# Patient Record
Sex: Male | Born: 1990 | Race: Black or African American | Hispanic: No | Marital: Single | Smoking: Current some day smoker
Health system: Southern US, Community
[De-identification: ages and names within clinical notes are randomized; demographics above are authoritative.]

## PROBLEM LIST (undated history)

## (undated) DIAGNOSIS — E669 Obesity, unspecified: Secondary | ICD-10-CM

## (undated) HISTORY — DX: Obesity, unspecified: E66.9

## (undated) HISTORY — PX: OTHER SURGICAL HISTORY: SHX169

## (undated) HISTORY — PX: ELBOW SURGERY: SHX618

---
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
## 2010-08-30 NOTE — Procedures (Signed)
EEG NUMBER:  [id number]   HISTORY:  This is a 20 year old with seizure-like episode who is having an  EEG done to evaluate for seizures.   PROCEDURE:  This is a routine EEG.   TECHNICAL DESCRIPTION:  Throughout this routine EEG there is a posterior  dominant rhythm of 11-12 Hz activity with 15-25 microvolts.  The background  activity is symmetric, mostly comprised of alpha range activity at 10-30  microvolts.  With photic stimulation there is a symmetric photic driving  response noted.  Hyperventilation does not procedure any significant  abnormalities.  The patient does not go to sleep throughout this recording.  Throughout this record there is no evidence of electrographic seizures or  interictal discharge activity.   IMPRESSION:  This routine EEG is within normal limits in the awake state.      Bevelyn Buckles. Nash Shearer, M.D.  Electronically Signed     [id number]  D:  11/17/2005 11:33:52  T:  11/17/2005 14:01:40  Job #:  [id number]

## 2011-04-15 HISTORY — PX: OTHER SURGICAL HISTORY: SHX169

## 2011-07-20 ENCOUNTER — Emergency Department (HOSPITAL_COMMUNITY): Payer: No Typology Code available for payment source

## 2011-07-20 ENCOUNTER — Emergency Department (HOSPITAL_COMMUNITY)
Admission: EM | Admit: 2011-07-20 | Discharge: 2011-07-20 | Disposition: A | Discharge status: Home / Self Care | Payer: No Typology Code available for payment source | Attending: Emergency Medicine | Admitting: Emergency Medicine

## 2011-07-20 ENCOUNTER — Encounter (HOSPITAL_COMMUNITY): Payer: Self-pay | Admitting: Emergency Medicine

## 2011-07-20 DIAGNOSIS — S53106A Unspecified dislocation of unspecified ulnohumeral joint, initial encounter: Secondary | ICD-10-CM

## 2011-07-20 DIAGNOSIS — M25429 Effusion, unspecified elbow: Secondary | ICD-10-CM | POA: Insufficient documentation

## 2011-07-20 DIAGNOSIS — S53144A Lateral dislocation of right ulnohumeral joint, initial encounter: Secondary | ICD-10-CM

## 2011-07-20 DIAGNOSIS — S42401A Unspecified fracture of lower end of right humerus, initial encounter for closed fracture: Secondary | ICD-10-CM

## 2011-07-20 DIAGNOSIS — S53116A Anterior dislocation of unspecified ulnohumeral joint, initial encounter: Secondary | ICD-10-CM | POA: Insufficient documentation

## 2011-07-20 DIAGNOSIS — M25529 Pain in unspecified elbow: Secondary | ICD-10-CM | POA: Insufficient documentation

## 2011-07-20 DIAGNOSIS — R61 Generalized hyperhidrosis: Secondary | ICD-10-CM | POA: Insufficient documentation

## 2011-07-20 DIAGNOSIS — S42409A Unspecified fracture of lower end of unspecified humerus, initial encounter for closed fracture: Secondary | ICD-10-CM | POA: Insufficient documentation

## 2011-07-20 MED ORDER — FENTANYL CITRATE 0.05 MG/ML IJ SOLN
INTRAMUSCULAR | Status: AC
  Filled 2011-07-20: qty 2

## 2011-07-20 MED ORDER — FENTANYL CITRATE 0.05 MG/ML IJ SOLN
100.0000 ug | Freq: Once | INTRAMUSCULAR | Status: AC
  Administered 2011-07-20: 100 ug via INTRAVENOUS

## 2011-07-20 MED ORDER — PROPOFOL 10 MG/ML IV EMUL
INTRAVENOUS | Status: AC
  Administered 2011-07-20: 80 mg via INTRAVENOUS
  Filled 2011-07-20: qty 40

## 2011-07-20 MED ORDER — HYDROMORPHONE HCL PF 1 MG/ML IJ SOLN
1.0000 mg | Freq: Once | INTRAMUSCULAR | Status: AC
  Administered 2011-07-20: 1 mg via INTRAVENOUS
  Filled 2011-07-20: qty 1

## 2011-07-20 MED ORDER — PROPOFOL 10 MG/ML IV EMUL
30.0000 mL | INTRAVENOUS | Status: DC
  Administered 2011-07-20: 40 mg via INTRAVENOUS
  Administered 2011-07-20: 80 mg via INTRAVENOUS

## 2011-07-20 MED ORDER — BUPIVACAINE HCL (PF) 0.5 % IJ SOLN
INTRAMUSCULAR | Status: AC
  Administered 2011-07-20: 15:00:00
  Filled 2011-07-20: qty 10

## 2011-07-20 MED ORDER — OXYCODONE-ACETAMINOPHEN 5-325 MG PO TABS: ORAL_TABLET | ORAL | Status: AC
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
## 2011-07-20 NOTE — ED Notes (Signed)
Steward Scale 5 
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
## 2011-07-20 NOTE — ED Notes (Signed)
Steward Scale-6 Pt alert and oriented. No distress noted.

## 2011-07-20 NOTE — ED Notes (Signed)
Pt being bagged by Dr. Fonnie Jarvis to increase O2 sats. Pt increased to 95%.

## 2011-07-20 NOTE — ED Notes (Signed)
Ortho MD at the bedside. Pt medicated for pain. Place on cardiac monitor. Preparing to reset pt right elbow.

## 2011-07-20 NOTE — ED Notes (Signed)
Procedure completed and pt alert and oriented at this time. Vital signs stable. No distress noted.

## 2011-07-20 NOTE — ED Provider Notes (Signed)
Procedure: PSA  Preprocedure  Pre-anesthesia/induction confirmation of laterality/correct procedure site including "time-out."  Provider confirms review of the nurses' note, allergies, medications, pertinent labs, PMH, pre-induction vital signs, pulse oximetry, pain level, and ECG (as applicable), and patient condition satisfactory for commencing with order for sedation and procedure.  Orthopedic surgery performed the right elbow dislocation closed reduction while I performed the procedure of sedation and analgesia the patient tolerated well. The patient was maintained on high flow nasal cannula oxygen preprocedure preprocedure and postprocedure. He did have transient hypoventilation with brief bag valve mask assisted ventilations for transient hypoxia sat to the 80s for less than 30 seconds, otherwise his pulse oximetry was 97-100% preprocedure during procedure and post procedure. Patient was awake alert following commands well post procedure and improved function of his right hand post procedure with intact sensation and motor function distributions of the radial median and ulnar nerve function with recovery capability of extending his digits on his right hand at the metacarpophalangeal joints which he was unable to do a preprocedure according to orthopedics.   Patient tolerated procedure and procedural sedation component as expected without apparent immediate complications.  Physician confirms procedural medication orders as administered, patient was assessed by physician post-procedure, and confirms post-sedation plan of care and disposition.  Hurman Horn, MD 07/21/11 2156

## 2011-07-20 NOTE — Discharge Instructions (Signed)
Elbow Dislocation  Elbow dislocation is the displacement of the bones that form the elbow joint. Three bones come together to form the elbow. The humerus is the bone in the upper arm. The radius and ulna are the 2 bones in the forearm that form the lower part of the elbow. The elbow is held in place by very strong, fibrous tissues (ligaments) that connect the bones to each other.  CAUSES  Elbow dislocations are not common. Typically, they occur when a person falls forward with hands and elbows outstretched. The force of the impact is sent to the elbow. Usually, there is a twisting motion in this force. Elbow dislocations also happen during car crashes when passengers reach out to brace themselves during the impact.  RISK FACTORS  Although dislocation of the elbow can happen to anyone, some people are at greater risk than others. People at increased risk of elbow dislocation include:   People born with greater looseness in their ligaments.   People born with an ulna bone that has a shallow groove for the elbow hinge joint.  SYMPTOMS  Symptoms of a complete elbow dislocation usually are obvious. They include extreme pain and the appearance of a deformed arm.   Symptoms of a partial dislocation may not be obvious. Your elbow may move somewhat, but you may have pain and swelling. Also, there will likely be bruising on the inside and outside of your elbow where ligaments have been stretched or torn.   DIAGNOSIS   To diagnose elbow dislocation, your caregiver will perform a physical exam. During this exam, your caregiver will check your arm for tenderness, swelling, and deformity. The skin around your arm and the circulation in your arm also will be checked. Your pulse will be checked at your wrist. If your artery is injured during dislocation, your hand will be cool to the touch and may be white or purple in color. Your caregiver also may check your arm and your ability to move your wrist and fingers to see if you had  any damage to your nerves during dislocation.  An X-ray exam also may be done to determine if there is bone injury. Results of an X-ray exam can help show the direction of the dislocation.  If you have a simple dislocation, there is no major bone injury. If you have a complex dislocation, you may have broken bones (fractures) associated with the ligament injuries.  TREATMENT  For a simple elbow dislocation, your bones can usually be realigned in a procedure called a reduction. This is a treatment in which your bones are manually moved back into place either with the use of numbing medicine (regional anesthetic) around your elbow or medicine to make you sleep (general anesthetic). Then your elbow is kept immobile with a sling or a splint for 2 to 3 weeks. This is followed with physical therapy to help your joint move again.  Complex elbow dislocation may require surgery to restore joint alignment and repair ligaments. After surgery, your elbow may be protected with an external hinge. This device keeps your elbow from dislocating again while motion exercises are done. Additional surgery may be needed to repair any injuries to blood vessels and nerves or bones and ligaments or to relieve pressure from excessive swelling around the muscles.  HOME CARE INSTRUCTIONS  The following measures can help to reduce pain and hasten the healing process:   Rest your injured joint. Do not move it. Avoid activities similar to the one that caused   your injury.   Exercise your hand and fingers as instructed by your caregiver.   Apply ice to your injured joint for 1 to 2 days after your reduction or as directed by your caregiver. Applying ice helps to reduce inflammation and pain.   Put ice in a plastic bag.   Place a towel between your skin and the bag.   Leave the ice on for 15 to 20 minutes at a time, every couple of hours while you are awake.   Elevate your arm above your heart and move your wrist and fingers as instructed by  your caregiver to help limit swelling.   Take over-the-counter or prescription medicines for pain as directed by your caregiver.  SEEK IMMEDIATE MEDICAL CARE IF:   Your splint becomes damaged.   You have an external hinge and it becomes loose or will not move.   You have an external hinge and you develop drainage around the pins.   Your pain becomes worse rather than better.   You lose feeling in your hand or fingers.  MAKE SURE YOU:   Understand these instructions.   Will watch your condition.   Will get help right away if you are not doing well or get worse.  Document Released: 03/25/2001 Document Revised: 03/20/2011 Document Reviewed: 08/29/2010  [employer] Patient Information 2012 [employer].
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
## 2011-07-20 NOTE — ED Notes (Signed)
Steward Scale-4 at this time. No acute distress noted.

## 2011-07-20 NOTE — ED Notes (Signed)
Elbow reset by Ortho MD at this time. No complications noted.

## 2011-09-09 ENCOUNTER — Other Ambulatory Visit: Payer: Self-pay | Admitting: Orthopedic Surgery

## 2011-09-09 DIAGNOSIS — S53106A Unspecified dislocation of unspecified ulnohumeral joint, initial encounter: Secondary | ICD-10-CM

## 2011-09-12 ENCOUNTER — Ambulatory Visit
Admission: RE | Admit: 2011-09-12 | Discharge: 2011-09-12 | Disposition: A | Discharge status: Home / Self Care | Payer: 59 | Source: Ambulatory Visit | Attending: Orthopedic Surgery | Admitting: Orthopedic Surgery

## 2011-09-12 DIAGNOSIS — S53106A Unspecified dislocation of unspecified ulnohumeral joint, initial encounter: Secondary | ICD-10-CM

## 2011-09-18 ENCOUNTER — Ambulatory Visit (INDEPENDENT_AMBULATORY_CARE_PROVIDER_SITE_OTHER): Payer: 59 | Admitting: Internal Medicine

## 2011-09-18 VITALS — BP 135/84 | HR 59 | Temp 98.5°F | Resp 18 | Ht 73.0 in | Wt 348.0 lb

## 2011-09-18 DIAGNOSIS — B354 Tinea corporis: Secondary | ICD-10-CM

## 2011-09-18 DIAGNOSIS — R21 Rash and other nonspecific skin eruption: Secondary | ICD-10-CM

## 2011-09-18 DIAGNOSIS — L299 Pruritus, unspecified: Secondary | ICD-10-CM

## 2011-09-18 LAB — POCT SKIN KOH: Skin KOH, POC: NEGATIVE

## 2011-09-18 MED ORDER — ECONAZOLE NITRATE 1 % EX CREA: TOPICAL_CREAM | Freq: Every day | CUTANEOUS | Status: DC

## 2011-09-18 NOTE — Progress Notes (Signed)
  Subjective:    Patient ID: Matthew Mejia, male    DOB: 11/24/1990, 21 y.o.   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

## 2011-09-18 NOTE — Patient Instructions (Signed)
Ringworm, Body [Tinea Corporis]  Ringworm is a fungal infection of the skin and hair. Another name for this problem is Tinea Corporis. It has nothing to do with worms. A fungus is an organism that lives on dead cells (the outer layer of skin). It can involve the entire body. It can spread from infected pets. Tinea corporis can be a problem in wrestlers who may get the infection form other players/opponents, equipment and mats.  DIAGNOSIS   A skin scraping can be obtained from the affected area and by looking for fungus under the microscope. This is called a KOH examination.   HOME CARE INSTRUCTIONS    Ringworm may be treated with a topical antifungal cream, ointment, or oral medications.   If you are using a cream or ointment, wash infected skin. Dry it completely before application.   Scrub the skin with a buff puff or abrasive sponge using a shampoo with ketoconazole to remove dead skin and help treat the ringworm.   Have your pet treated by your veterinarian if it has the same infection.  SEEK MEDICAL CARE IF:    Your ringworm patch (fungus) continues to spread after 7 days of treatment.   Your rash is not gone in 4 weeks. Fungal infections are slow to respond to treatment. Some redness (erythema) may remain for several weeks after the fungus is gone.   The area becomes red, warm, tender, and swollen beyond the patch. This may be a secondary bacterial (germ) infection.   You have a fever.  Document Released: 03/28/2000 Document Revised: 03/20/2011 Document Reviewed: 09/08/2008  [employer] Patient Information 2012 [employer].

## 2011-10-21 ENCOUNTER — Ambulatory Visit: Payer: 59 | Attending: Orthopedic Surgery

## 2011-10-21 DIAGNOSIS — IMO0001 Reserved for inherently not codable concepts without codable children: Secondary | ICD-10-CM | POA: Insufficient documentation

## 2011-10-21 DIAGNOSIS — M25639 Stiffness of unspecified wrist, not elsewhere classified: Secondary | ICD-10-CM | POA: Insufficient documentation

## 2011-10-21 DIAGNOSIS — M25539 Pain in unspecified wrist: Secondary | ICD-10-CM | POA: Insufficient documentation

## 2011-10-29 ENCOUNTER — Ambulatory Visit: Payer: 59

## 2011-10-31 ENCOUNTER — Ambulatory Visit: Payer: 59

## 2011-11-04 ENCOUNTER — Ambulatory Visit: Payer: 59

## 2011-11-06 ENCOUNTER — Ambulatory Visit: Payer: 59

## 2011-11-14 ENCOUNTER — Ambulatory Visit: Payer: 59 | Attending: Orthopedic Surgery | Admitting: Physical Therapy

## 2011-11-14 DIAGNOSIS — M25639 Stiffness of unspecified wrist, not elsewhere classified: Secondary | ICD-10-CM | POA: Insufficient documentation

## 2011-11-14 DIAGNOSIS — IMO0001 Reserved for inherently not codable concepts without codable children: Secondary | ICD-10-CM | POA: Insufficient documentation

## 2011-11-14 DIAGNOSIS — M25539 Pain in unspecified wrist: Secondary | ICD-10-CM | POA: Insufficient documentation

## 2011-11-17 ENCOUNTER — Ambulatory Visit: Payer: 59

## 2011-11-19 ENCOUNTER — Ambulatory Visit: Payer: 59

## 2011-11-27 ENCOUNTER — Ambulatory Visit: Payer: 59

## 2011-12-02 ENCOUNTER — Other Ambulatory Visit: Payer: Self-pay | Admitting: Orthopedic Surgery

## 2011-12-02 DIAGNOSIS — S53106A Unspecified dislocation of unspecified ulnohumeral joint, initial encounter: Secondary | ICD-10-CM

## 2011-12-03 ENCOUNTER — Ambulatory Visit
Admission: RE | Admit: 2011-12-03 | Discharge: 2011-12-03 | Disposition: A | Discharge status: Home / Self Care | Payer: 59 | Source: Ambulatory Visit | Attending: Orthopedic Surgery | Admitting: Orthopedic Surgery

## 2011-12-03 DIAGNOSIS — S53106A Unspecified dislocation of unspecified ulnohumeral joint, initial encounter: Secondary | ICD-10-CM

## 2012-07-04 ENCOUNTER — Ambulatory Visit (INDEPENDENT_AMBULATORY_CARE_PROVIDER_SITE_OTHER): Payer: 59 | Admitting: Family Medicine

## 2012-07-04 ENCOUNTER — Ambulatory Visit: Payer: 59

## 2012-07-04 VITALS — BP 141/84 | HR 83 | Temp 98.0°F | Resp 18 | Ht 72.25 in | Wt 346.8 lb

## 2012-07-04 DIAGNOSIS — M549 Dorsalgia, unspecified: Secondary | ICD-10-CM

## 2012-07-04 DIAGNOSIS — R079 Chest pain, unspecified: Secondary | ICD-10-CM

## 2012-07-04 DIAGNOSIS — T148XXA Other injury of unspecified body region, initial encounter: Secondary | ICD-10-CM

## 2012-07-04 DIAGNOSIS — M940 Chondrocostal junction syndrome [Tietze]: Secondary | ICD-10-CM

## 2012-07-04 DIAGNOSIS — Z131 Encounter for screening for diabetes mellitus: Secondary | ICD-10-CM

## 2012-07-04 LAB — POCT CBC
Granulocyte percent: 63.9 %G (ref 37–80)
HCT, POC: 48.5 % (ref 43.5–53.7)
Hemoglobin: 16.3 g/dL (ref 14.1–18.1)
Lymph, poc: 2.6 (ref 0.6–3.4)
MCH, POC: 30.2 pg (ref 27–31.2)
MCHC: 33.6 g/dL (ref 31.8–35.4)
MCV: 89.9 fL (ref 80–97)
MID (cbc): 0.4 (ref 0–0.9)
MPV: 8.9 fL (ref 0–99.8)
POC Granulocyte: 5.4 (ref 2–6.9)
POC LYMPH PERCENT: 30.9 %L (ref 10–50)
POC MID %: 5.2 %M (ref 0–12)
Platelet Count, POC: 315 10*3/uL (ref 142–424)
RBC: 5.4 M/uL (ref 4.69–6.13)
RDW, POC: 14.7 %
WBC: 8.4 10*3/uL (ref 4.6–10.2)

## 2012-07-04 LAB — POCT GLYCOSYLATED HEMOGLOBIN (HGB A1C): Hemoglobin A1C: 5.1

## 2012-07-04 MED ORDER — CYCLOBENZAPRINE HCL 5 MG PO TABS: 5.0000 mg | ORAL_TABLET | Freq: Every evening | ORAL | Status: DC | PRN

## 2012-07-04 MED ORDER — DICLOFENAC SODIUM 75 MG PO TBEC: 75.0000 mg | DELAYED_RELEASE_TABLET | Freq: Two times a day (BID) | ORAL | Status: DC

## 2012-07-04 MED ORDER — HYDROCODONE-ACETAMINOPHEN 5-325 MG PO TABS: 1.0000 | ORAL_TABLET | Freq: Four times a day (QID) | ORAL | Status: DC | PRN

## 2012-07-04 NOTE — Patient Instructions (Signed)
Costochondritis Costochondritis (Tietze syndrome), or costochondral separation, is a swelling and irritation (inflammation) of the tissue (cartilage) that connects your ribs with your breastbone (sternum). It may occur on its own (spontaneously), through damage caused by an accident (trauma), or simply from coughing or minor exercise. It may take up to 6 weeks to get better and longer if you are unable to be conservative in your activities. HOME CARE INSTRUCTIONS   Avoid exhausting physical activity. Try not to strain your ribs during normal activity. This would include any activities using chest, belly (abdominal), and side muscles, especially if heavy weights are used.  Use ice for 15 to 20 minutes per hour while awake for the first 2 days. Place the ice in a plastic bag, and place a towel between the bag of ice and your skin.  Only take over-the-counter or prescription medicines for pain, discomfort, or fever as directed by your caregiver. SEEK IMMEDIATE MEDICAL CARE IF:   Your pain increases or you are very uncomfortable.  You have a fever.  You develop difficulty with your breathing.  You cough up blood.  You develop worse chest pains, shortness of breath, sweating, or vomiting.  You develop new, unexplained problems (symptoms). MAKE SURE YOU:   Understand these instructions.  Will watch your condition.  Will get help right away if you are not doing well or get worse. Document Released: 01/08/2005 Document Revised: 06/23/2011 Document Reviewed: 11/17/2007 [facility] Patient Information 2013 [place], [place]. Back Exercises Back exercises help treat and prevent back injuries. The goal is to increase your strength in your belly (abdominal) and back muscles. These exercises can also help with flexibility. Start these exercises when told by your doctor. HOME CARE Back exercises include: Pelvic Tilt.  Lie on your back with your knees bent. Tilt your pelvis until the lower part of  your back is against the floor. Hold this position 5 to 10 sec. Repeat this exercise 5 to 10 times. Knee to Chest.  Pull 1 knee up against your chest and hold for 20 to 30 seconds. Repeat this with the other knee. This may be done with the other leg straight or bent, whichever feels better. Then, pull both knees up against your chest. Sit-Ups or Curl-Ups.  Bend your knees 90 degrees. Start with tilting your pelvis, and do a partial, slow sit-up. Only lift your upper half 30 to 45 degrees off the floor. Take at least 2 to 3 seonds for each sit-up. Do not do sit-ups with your knees out straight. If partial sit-ups are difficult, simply do the above but with only tightening your belly (abdominal) muscles and holding it as told. Hip-Lift.  Lie on your back with your knees flexed 90 degrees. Push down with your feet and shoulders as you raise your hips 2 inches off the floor. Hold for 10 seconds, repeat 5 to 10 times. Back Arches.  Lie on your stomach. Prop yourself up on bent elbows. Slowly press on your hands, causing an arch in your low back. Repeat 3 to 5 times. Shoulder-Lifts.  Lie face down with arms beside your body. Keep hips and belly pressed to floor as you slowly lift your head and shoulders off the floor. Do not overdo your exercises. Be careful in the beginning. Exercises may cause you some mild back discomfort. If the pain lasts for more than 15 minutes, stop the exercises until you see your doctor. Improvement with exercise for back problems is slow.  Document Released: 05/03/2010 Document Revised: 06/23/2011  Document Reviewed: 01/30/2011 [facility] Patient Information 2013 [place], [place].
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
## 2012-07-10 ENCOUNTER — Emergency Department (HOSPITAL_COMMUNITY)
Admission: EM | Admit: 2012-07-10 | Discharge: 2012-07-10 | Disposition: A | Discharge status: Home / Self Care | Payer: 59 | Attending: Emergency Medicine | Admitting: Emergency Medicine

## 2012-07-10 ENCOUNTER — Encounter (HOSPITAL_COMMUNITY): Payer: Self-pay | Admitting: Emergency Medicine

## 2012-07-10 DIAGNOSIS — IMO0002 Reserved for concepts with insufficient information to code with codable children: Secondary | ICD-10-CM | POA: Insufficient documentation

## 2012-07-10 DIAGNOSIS — F17200 Nicotine dependence, unspecified, uncomplicated: Secondary | ICD-10-CM | POA: Insufficient documentation

## 2012-07-10 DIAGNOSIS — E669 Obesity, unspecified: Secondary | ICD-10-CM | POA: Insufficient documentation

## 2012-07-10 DIAGNOSIS — M545 Low back pain, unspecified: Secondary | ICD-10-CM

## 2012-07-10 DIAGNOSIS — Y990 Civilian activity done for income or pay: Secondary | ICD-10-CM | POA: Insufficient documentation

## 2012-07-10 DIAGNOSIS — Y9389 Activity, other specified: Secondary | ICD-10-CM | POA: Insufficient documentation

## 2012-07-10 DIAGNOSIS — Y9289 Other specified places as the place of occurrence of the external cause: Secondary | ICD-10-CM | POA: Insufficient documentation

## 2012-07-10 DIAGNOSIS — X500XXA Overexertion from strenuous movement or load, initial encounter: Secondary | ICD-10-CM | POA: Insufficient documentation
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
## 2012-07-10 NOTE — ED Notes (Signed)
Pt reports "my whole back hurts."  C/o back pain x 1 week.  States he started a new job 2 weeks ago and is having to do a lot of bending.

## 2012-07-19 ENCOUNTER — Encounter: Payer: Self-pay | Admitting: *Deleted

## 2012-07-19 ENCOUNTER — Encounter: Payer: Self-pay | Admitting: Family Medicine

## 2012-07-19 ENCOUNTER — Ambulatory Visit (INDEPENDENT_AMBULATORY_CARE_PROVIDER_SITE_OTHER): Payer: 59 | Admitting: Family Medicine

## 2012-07-19 VITALS — BP 120/92 | HR 88 | Temp 98.4°F | Ht 73.0 in | Wt 352.6 lb

## 2012-07-19 DIAGNOSIS — M79609 Pain in unspecified limb: Secondary | ICD-10-CM

## 2012-07-19 DIAGNOSIS — M62830 Muscle spasm of back: Secondary | ICD-10-CM | POA: Insufficient documentation

## 2012-07-19 DIAGNOSIS — M5380 Other specified dorsopathies, site unspecified: Secondary | ICD-10-CM

## 2012-07-19 DIAGNOSIS — M549 Dorsalgia, unspecified: Secondary | ICD-10-CM

## 2012-07-19 DIAGNOSIS — T148XXA Other injury of unspecified body region, initial encounter: Secondary | ICD-10-CM

## 2012-07-19 DIAGNOSIS — M79641 Pain in right hand: Secondary | ICD-10-CM

## 2012-07-19 LAB — CBC WITH DIFFERENTIAL/PLATELET
Basophils Absolute: 0 10*3/uL (ref 0.0–0.1)
Eosinophils Absolute: 0.2 10*3/uL (ref 0.0–0.7)
Lymphs Abs: 2 10*3/uL (ref 0.7–4.0)
MCHC: 33.6 g/dL (ref 30.0–36.0)
MCV: 86.5 fl (ref 78.0–100.0)
Monocytes Absolute: 0.5 10*3/uL (ref 0.1–1.0)
Neutrophils Relative %: 59.5 % (ref 43.0–77.0)
Platelets: 260 10*3/uL (ref 150.0–400.0)
RDW: 14.2 % (ref 11.5–14.6)
WBC: 6.7 10*3/uL (ref 4.5–10.5)

## 2012-07-19 LAB — LIPID PANEL
HDL: 35.3 mg/dL — ABNORMAL LOW (ref >39.00)
Total CHOL/HDL Ratio: 5
Triglycerides: 148 mg/dL (ref 0.0–149.0)
VLDL: 29.6 mg/dL (ref 0.0–40.0)

## 2012-07-19 LAB — BASIC METABOLIC PANEL
Calcium: 8.7 mg/dL (ref 8.4–10.5)
GFR: 92.03 mL/min (ref >60.00)
Potassium: 4.2 mEq/L (ref 3.5–5.1)
Sodium: 138 mEq/L (ref 135–145)

## 2012-07-19 LAB — HEPATIC FUNCTION PANEL
AST: 34 U/L (ref 0–37)
Alkaline Phosphatase: 46 U/L (ref 39–117)
Total Bilirubin: 0.4 mg/dL (ref 0.3–1.2)

## 2012-07-19 MED ORDER — CYCLOBENZAPRINE HCL 5 MG PO TABS: 5.0000 mg | ORAL_TABLET | Freq: Every evening | ORAL | Status: DC | PRN

## 2012-07-19 MED ORDER — MELOXICAM 15 MG PO TABS: 15.0000 mg | ORAL_TABLET | Freq: Every day | ORAL | Status: DC
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
## 2012-07-19 NOTE — Patient Instructions (Addendum)
Schedule your complete physical at your convenienc We'll notify you of your lab results and make any changes if needed Start the Mobic once daily for inflammation Use the flexeril as needed for spasm Use a heating pad as needed for spasm/pain relief We'll call you with your physical therapy appt Call with any questions or concerns Hang in there!!

## 2012-07-20 NOTE — Assessment & Plan Note (Signed)
New to provider, ongoing for pt.  Suspect this is factoring into his low back pain.  Check labs to risk stratify.  Stressed importance of healthy diet and regular exercise.  Will follow.

## 2012-07-20 NOTE — Assessment & Plan Note (Signed)
New.  No evidence of bony tenderness, (-) SLR, no red flags on hx or PE.  Suspect his weight and recent job change (now on feet all day) are contributing to pain.  Will start scheduled NSAIDs, muscle relaxer for night.  Refer to PT for core strengthening.  No narcotics at this time.  Will follow.

## 2012-07-20 NOTE — Assessment & Plan Note (Signed)
New.  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  Start daily NSAIDs.  No evidence to support gout, RA, or other inflammatory or autoimmune process.  Will follow.

## 2012-07-28 NOTE — ED Provider Notes (Signed)
Medical screening examination/treatment/procedure(s) were performed by non-physician practitioner and as supervising physician I was immediately available for consultation/collaboration.  Arliss Hepburn, MD 07/28/12 0819 

## 2013-08-17 ENCOUNTER — Ambulatory Visit (INDEPENDENT_AMBULATORY_CARE_PROVIDER_SITE_OTHER): Payer: 59 | Admitting: Family Medicine

## 2013-08-17 VITALS — BP 118/86 | HR 95 | Temp 98.4°F | Resp 16 | Ht 72.0 in | Wt 325.6 lb

## 2013-08-17 DIAGNOSIS — R059 Cough, unspecified: Secondary | ICD-10-CM

## 2013-08-17 DIAGNOSIS — J029 Acute pharyngitis, unspecified: Secondary | ICD-10-CM

## 2013-08-17 DIAGNOSIS — R05 Cough: Secondary | ICD-10-CM

## 2013-08-17 DIAGNOSIS — J22 Unspecified acute lower respiratory infection: Secondary | ICD-10-CM

## 2013-08-17 DIAGNOSIS — J988 Other specified respiratory disorders: Secondary | ICD-10-CM

## 2013-08-17 LAB — POCT RAPID STREP A (OFFICE): Rapid Strep A Screen: NEGATIVE

## 2013-08-17 MED ORDER — BENZONATATE 100 MG PO CAPS: 200.0000 mg | ORAL_CAPSULE | Freq: Two times a day (BID) | ORAL | Status: DC | PRN

## 2013-08-17 MED ORDER — AMOXICILLIN-POT CLAVULANATE 875-125 MG PO TABS: 1.0000 | ORAL_TABLET | Freq: Two times a day (BID) | ORAL | Status: DC

## 2013-08-17 MED ORDER — HYDROCODONE-HOMATROPINE 5-1.5 MG/5ML PO SYRP: 5.0000 mL | ORAL_SOLUTION | Freq: Every evening | ORAL | Status: DC | PRN

## 2013-08-17 NOTE — Progress Notes (Signed)
 Chief Complaint:  Chief Complaint  Patient presents with  . Cough    HPI: Matthew Mejia is a 23 y.o. male who is here for thick green discharge, coughing up stuff x 3 days. He has sorenes sin his back when he cough, he feel like "he is throwing up a lung". Denies  fevers or chills. + night sweats. No sick contacts , no ear pain, no SOB/CP. HAs some facial pain.  He ahs tied nothing for it. Has tried Halls without releif.  He and his partner are fhaving a baby in 1 week, he is not sure if he is UTD on his TDaP They will name her Angelica RanLiana Thsi will be hist first baby   Past Medical History  Diagnosis Date  . Obesity    Past Surgical History  Procedure Laterality Date  . Left knee sx 4-5 years ago    . Knee surgery  2006  . Arm surgery  2013  . Elbow surgery     History   Social History  . Marital Status: Single    Spouse Name: N/A    Number of Children: N/A  . Years of Education: N/A   Social History Main Topics  . Smoking status: Current Every Day Smoker -- 0.50 packs/day for 0 years    Types: Cigarettes  . Smokeless tobacco: Never Used  . Alcohol Use: 1.2 oz/week    2 Cans of beer per week  . Drug Use: No  . Sexual Activity: Yes    Birth Control/ Protection: Condom   Other Topics Concern  . None   Social History Narrative  . None   Family History  Problem Relation Age of Onset  . Diabetes Mother   . Heart disease Mother   . Kidney disease Mother   . Hypertension Mother   . Hyperlipidemia Mother   . Hypertension Father    No Known Allergies Prior to Admission medications   Medication Sig Start Date End Date Taking? Authorizing Provider  cyclobenzaprine (FLEXERIL) 5 MG tablet Take 1 tablet (5 mg total) by mouth at bedtime as needed for muscle spasms. 07/19/12   Sheliah HatchKatherine E Tabori, MD  meloxicam (MOBIC) 15 MG tablet Take 1 tablet (15 mg total) by mouth daily. 07/19/12   Sheliah HatchKatherine E Tabori, MD     ROS: The patient denies night sweats, unintentional  weight loss, chest pain, palpitations, wheezing, dyspnea on exertion, nausea, vomiting, abdominal pain, dysuria, hematuria, melena, numbness, weakness, or tingling.   All other systems have been reviewed and were otherwise negative with the exception of those mentioned in the HPI and as above.    PHYSICAL EXAM: Filed Vitals:   08/17/13 1400  BP: 118/86  Pulse: 95  Temp: 98.4 F (36.9 C)  Resp: 16   Filed Vitals:   08/17/13 1400  Height: 6' (1.829 m)  Weight: 325 lb 9.6 oz (147.691 kg)   Body mass index is 44.15 kg/(m^2).  General: Alert, no acute distress HEENT:  Normocephalic, atraumatic, oropharynx patent. EOMI, PERRLA, tm nl, + sinus tenderness, erythem throat, no exudates Cardiovascular:  Regular rate and rhythm, no rubs murmurs or gallops.  No Carotid bruits, radial pulse intact. No pedal edema.  Respiratory: Clear to auscultation bilaterally.  No wheezes, rales, or rhonchi.  No cyanosis, no use of accessory musculature GI: No organomegaly, abdomen is soft and non-tender, positive bowel sounds.  No masses. Skin: No rashes. Neurologic: Facial musculature symmetric. Psychiatric: Patient is appropriate throughout our interaction.  Lymphatic: No cervical lymphadenopathy Musculoskeletal: Gait intact.   LABS: Results for orders placed in visit on 08/17/13  CULTURE, GROUP A STREP      Result Value Ref Range   Preliminary Report No Suspicious Colonies, Continuing to Hold    POCT RAPID STREP A (OFFICE)      Result Value Ref Range   Rapid Strep A Screen Negative  Negative     EKG/XRAY:   [id number]   ASSESSMENT/PLAN: Encounter Diagnoses  Name Primary?  . Acute pharyngitis Yes  . Lower respiratory infection   . Cough    Throat cx pending Rx Augmentin if no improvement with sx treatment Rx Hycodan, rx Tessalon Perles HE will return to get chest xray prn also needs TDaP at some point F/u prn  Gross sideeffects, risk and benefits, and  alternatives of medications d/w patient. Patient is aware that all medications have potential sideeffects and we are unable to predict every sideeffect or drug-drug interaction that may occur.   P , DO 08/19/2013 8:15 AM

## 2013-08-19 LAB — CULTURE, GROUP A STREP: Organism ID, Bacteria: NORMAL

## 2014-03-22 ENCOUNTER — Ambulatory Visit (INDEPENDENT_AMBULATORY_CARE_PROVIDER_SITE_OTHER): Payer: 59 | Admitting: Emergency Medicine

## 2014-03-22 ENCOUNTER — Ambulatory Visit (INDEPENDENT_AMBULATORY_CARE_PROVIDER_SITE_OTHER): Payer: 59

## 2014-03-22 VITALS — BP 138/76 | HR 90 | Temp 98.2°F | Resp 18 | Ht 73.0 in | Wt 375.0 lb

## 2014-03-22 DIAGNOSIS — M25572 Pain in left ankle and joints of left foot: Secondary | ICD-10-CM

## 2014-03-22 DIAGNOSIS — S9032XA Contusion of left foot, initial encounter: Secondary | ICD-10-CM

## 2014-03-22 DIAGNOSIS — S9031XA Contusion of right foot, initial encounter: Secondary | ICD-10-CM

## 2014-03-22 MED ORDER — HYDROCODONE-ACETAMINOPHEN 5-325 MG PO TABS: 1.0000 | ORAL_TABLET | ORAL | Status: DC | PRN

## 2014-03-22 NOTE — Patient Instructions (Signed)

## 2014-03-22 NOTE — Progress Notes (Signed)
[facility] [address], [place] [id number] [phone]  Date:  03/22/2014   Name:  Matthew Mejia   DOB:  19-Nov-1990   MRN:  [id number]  PCP:  Neena RhymesKatherine Tabori, MD    Chief Complaint: Foot Pain   History of Present Illness:  Matthew Mejia is a 23 y.o. very pleasant male patient who presents with the following:  Sunday injured his foot.  Uncertain of exact injury.  Has progressively increasing swelling and pain Not able to bear weight. No improvement with over the counter medications or other home remedies.  Denies other complaint or health concern today.   Patient Active Problem List   Diagnosis Date Noted  . Lumbar paraspinal muscle spasm 07/19/2012  . Bilateral hand pain 07/19/2012  . Morbid obesity 07/19/2012  . Dislocation of elbow, lateral, right, closed 07/20/2011    Past Medical History  Diagnosis Date  . Obesity     Past Surgical History  Procedure Laterality Date  . Left knee sx 4-5 years ago    . Knee surgery  2006  . Arm surgery  2013  . Elbow surgery      History  Substance Use Topics  . Smoking status: Current Every Day Smoker -- 0.50 packs/day for 0 years    Types: Cigarettes  . Smokeless tobacco: Never Used  . Alcohol Use: 1.2 oz/week    2 Cans of beer per week    Family History  Problem Relation Age of Onset  . Diabetes Mother   . Heart disease Mother   . Kidney disease Mother   . Hypertension Mother   . Hyperlipidemia Mother   . Hypertension Father     No Known Allergies  Medication list has been reviewed and updated.  Current Outpatient Prescriptions on File Prior to Visit  Medication Sig Dispense Refill  . amoxicillin-clavulanate (AUGMENTIN) 875-125 MG per tablet Take 1 tablet by mouth 2 (two) times daily. (Patient not taking: Reported on 03/22/2014) 20 tablet 0  . benzonatate (TESSALON) 100 MG capsule Take 2 capsules (200 mg total) by mouth 2 (two) times daily as needed for cough. (Patient not taking:  Reported on 03/22/2014) 30 capsule 1  . cyclobenzaprine (FLEXERIL) 5 MG tablet Take 1 tablet (5 mg total) by mouth at bedtime as needed for muscle spasms. (Patient not taking: Reported on 03/22/2014) 30 tablet 1  . HYDROcodone-homatropine (HYCODAN) 5-1.5 MG/5ML syrup Take 5 mLs by mouth at bedtime as needed for cough. (Patient not taking: Reported on 03/22/2014) 120 mL 0  . meloxicam (MOBIC) 15 MG tablet Take 1 tablet (15 mg total) by mouth daily. (Patient not taking: Reported on 03/22/2014) 30 tablet 3   No current facility-administered medications on file prior to visit.    Review of Systems:  As per HPI, otherwise negative.    Physical Examination: Filed Vitals:   03/22/14 1438  BP: 138/76  Pulse: 90  Temp: 98.2 F (36.8 C)  Resp: 18   Filed Vitals:   03/22/14 1438  Height: 6\' 1"  (1.854 m)  Weight: 375 lb (170.099 kg)   Body mass index is 49.49 kg/(m^2). Ideal Body Weight: Weight in (lb) to have BMI = 25: 189.1   GEN: morbid obesity, NAD, Non-toxic, Alert & Oriented x 3 HEENT: Atraumatic, Normocephalic.  Ears and Nose: No external deformity. EXTR: No clubbing/cyanosis/edema NEURO: Normal gait.  PSYCH: Normally interactive. Conversant. Not depressed or anxious appearing.  Calm demeanor.  Left foot tender fifth metatarsal with no  deformity but moderate swelling   Assessment and Plan: Contusion foot Boot RICE vicodin  Signed,  Phillips OdorJeffery Romulus Hanrahan, MD   [facility] reading (PRIMARY) by  Dr. Dareen PianoAnderson.  No osseous injury.

## 2015-01-13 ENCOUNTER — Ambulatory Visit: Payer: Self-pay

## 2016-04-29 ENCOUNTER — Ambulatory Visit: Payer: Self-pay

## 2016-07-04 ENCOUNTER — Other Ambulatory Visit: Payer: Self-pay | Admitting: Orthopedic Surgery

## 2016-07-04 DIAGNOSIS — R52 Pain, unspecified: Secondary | ICD-10-CM

## 2016-07-04 DIAGNOSIS — R609 Edema, unspecified: Secondary | ICD-10-CM

## 2016-07-13 ENCOUNTER — Ambulatory Visit
Admission: RE | Admit: 2016-07-13 | Discharge: 2016-07-13 | Disposition: A | Discharge status: Home / Self Care | Payer: BLUE CROSS/BLUE SHIELD | Source: Ambulatory Visit | Attending: Orthopedic Surgery | Admitting: Orthopedic Surgery

## 2016-07-13 DIAGNOSIS — R609 Edema, unspecified: Secondary | ICD-10-CM

## 2016-07-13 DIAGNOSIS — R52 Pain, unspecified: Secondary | ICD-10-CM

## 2017-03-01 ENCOUNTER — Observation Stay (HOSPITAL_COMMUNITY)
Admission: EM | Admit: 2017-03-01 | Discharge: 2017-03-03 | Disposition: A | Discharge status: Home / Self Care | Payer: BLUE CROSS/BLUE SHIELD | Attending: Internal Medicine | Admitting: Internal Medicine

## 2017-03-01 ENCOUNTER — Emergency Department (HOSPITAL_COMMUNITY): Payer: BLUE CROSS/BLUE SHIELD

## 2017-03-01 ENCOUNTER — Other Ambulatory Visit: Payer: Self-pay

## 2017-03-01 ENCOUNTER — Encounter (HOSPITAL_COMMUNITY): Payer: Self-pay | Admitting: Emergency Medicine

## 2017-03-01 DIAGNOSIS — Z6841 Body Mass Index (BMI) 40.0 and over, adult: Secondary | ICD-10-CM | POA: Insufficient documentation

## 2017-03-01 DIAGNOSIS — R519 Headache, unspecified: Secondary | ICD-10-CM

## 2017-03-01 DIAGNOSIS — M542 Cervicalgia: Secondary | ICD-10-CM

## 2017-03-01 DIAGNOSIS — R509 Fever, unspecified: Secondary | ICD-10-CM | POA: Diagnosis present

## 2017-03-01 DIAGNOSIS — R05 Cough: Secondary | ICD-10-CM | POA: Diagnosis not present

## 2017-03-01 DIAGNOSIS — R51 Headache: Secondary | ICD-10-CM

## 2017-03-01 DIAGNOSIS — G030 Nonpyogenic meningitis: Principal | ICD-10-CM | POA: Insufficient documentation

## 2017-03-01 DIAGNOSIS — F17290 Nicotine dependence, other tobacco product, uncomplicated: Secondary | ICD-10-CM | POA: Insufficient documentation

## 2017-03-01 DIAGNOSIS — Z8249 Family history of ischemic heart disease and other diseases of the circulatory system: Secondary | ICD-10-CM | POA: Diagnosis not present

## 2017-03-01 DIAGNOSIS — R400 Somnolence: Secondary | ICD-10-CM

## 2017-03-01 LAB — URINALYSIS, ROUTINE W REFLEX MICROSCOPIC
BACTERIA UA: NONE SEEN
Bilirubin Urine: NEGATIVE
Glucose, UA: NEGATIVE mg/dL
Hgb urine dipstick: NEGATIVE
Ketones, ur: 5 mg/dL — AB
Nitrite: NEGATIVE
PROTEIN: NEGATIVE mg/dL
SQUAMOUS EPITHELIAL / LPF: NONE SEEN
Specific Gravity, Urine: 1.032 — ABNORMAL HIGH (ref 1.005–1.030)
pH: 5 (ref 5.0–8.0)

## 2017-03-01 LAB — INFLUENZA PANEL BY PCR (TYPE A & B)
INFLAPCR: NEGATIVE
Influenza B By PCR: NEGATIVE

## 2017-03-01 LAB — COMPREHENSIVE METABOLIC PANEL
ALBUMIN: 3.9 g/dL (ref 3.5–5.0)
ALK PHOS: 51 U/L (ref 38–126)
ALT: 35 U/L (ref 17–63)
ANION GAP: 10 (ref 5–15)
AST: 32 U/L (ref 15–41)
BUN: 15 mg/dL (ref 6–20)
CALCIUM: 8.5 mg/dL — AB (ref 8.9–10.3)
CO2: 26 mmol/L (ref 22–32)
Chloride: 102 mmol/L (ref 101–111)
Creatinine, Ser: 1.4 mg/dL — ABNORMAL HIGH (ref 0.61–1.24)
GFR calc Af Amer: >60 mL/min (ref >60)
GFR calc non Af Amer: >60 mL/min (ref >60)
GLUCOSE: 95 mg/dL (ref 65–99)
Potassium: 3.5 mmol/L (ref 3.5–5.1)
SODIUM: 138 mmol/L (ref 135–145)
Total Bilirubin: 0.6 mg/dL (ref 0.3–1.2)
Total Protein: 7.3 g/dL (ref 6.5–8.1)

## 2017-03-01 LAB — CBC WITH DIFFERENTIAL/PLATELET
BASOS PCT: 0 %
Basophils Absolute: 0 10*3/uL (ref 0.0–0.1)
Eosinophils Absolute: 0 10*3/uL (ref 0.0–0.7)
Eosinophils Relative: 0 %
HEMATOCRIT: 42.4 % (ref 39.0–52.0)
Hemoglobin: 14.4 g/dL (ref 13.0–17.0)
LYMPHS PCT: 20 %
Lymphs Abs: 1.9 10*3/uL (ref 0.7–4.0)
MCH: 29.9 pg (ref 26.0–34.0)
MCHC: 34 g/dL (ref 30.0–36.0)
MCV: 88 fL (ref 78.0–100.0)
MONO ABS: 0.4 10*3/uL (ref 0.1–1.0)
MONOS PCT: 4 %
NEUTROS ABS: 7 10*3/uL (ref 1.7–7.7)
Neutrophils Relative %: 76 %
Platelets: 259 10*3/uL (ref 150–400)
RBC: 4.82 MIL/uL (ref 4.22–5.81)
RDW: 14 % (ref 11.5–15.5)
WBC: 9.3 10*3/uL (ref 4.0–10.5)

## 2017-03-01 LAB — I-STAT CG4 LACTIC ACID, ED: Lactic Acid, Venous: 1.14 mmol/L (ref 0.5–1.9)

## 2017-03-01 MED ORDER — SODIUM CHLORIDE 0.9 % IV SOLN: 2.0000 g | Freq: Once | INTRAVENOUS | Status: DC

## 2017-03-01 MED ORDER — SODIUM CHLORIDE 0.9 % IV BOLUS (SEPSIS)
1000.0000 mL | Freq: Once | INTRAVENOUS | Status: AC
  Administered 2017-03-02: 1000 mL via INTRAVENOUS

## 2017-03-01 MED ORDER — DEXTROSE 5 % IV SOLN: 2.0000 g | INTRAVENOUS | Status: DC

## 2017-03-01 MED ORDER — SODIUM CHLORIDE 0.9 % IV SOLN
2500.0000 mg | INTRAVENOUS | Status: DC
  Filled 2017-03-01: qty 2500

## 2017-03-01 MED ORDER — AMPICILLIN SODIUM 2 G IJ SOLR
2.0000 g | INTRAMUSCULAR | Status: DC
  Filled 2017-03-01 (×3): qty 2000

## 2017-03-01 MED ORDER — DEXAMETHASONE SODIUM PHOSPHATE 10 MG/ML IJ SOLN
10.0000 mg | Freq: Once | INTRAMUSCULAR | Status: DC
  Filled 2017-03-01: qty 1

## 2017-03-01 MED ORDER — LIDOCAINE HCL 1 % IJ SOLN
INTRAMUSCULAR | Status: AC
  Administered 2017-03-02: 20 mL
  Filled 2017-03-01: qty 20

## 2017-03-01 MED ORDER — DEXTROSE 5 % IV SOLN
2.0000 g | Freq: Once | INTRAVENOUS | Status: DC
  Filled 2017-03-01: qty 2

## 2017-03-01 MED ORDER — SODIUM CHLORIDE 0.9 % IV BOLUS (SEPSIS)
1000.0000 mL | Freq: Once | INTRAVENOUS | Status: AC
  Administered 2017-03-01: 1000 mL via INTRAVENOUS

## 2017-03-01 MED ORDER — ACETAMINOPHEN 325 MG PO TABS
650.0000 mg | ORAL_TABLET | Freq: Once | ORAL | Status: AC
  Administered 2017-03-01: 650 mg via ORAL
  Filled 2017-03-01: qty 2

## 2017-03-01 MED ORDER — VANCOMYCIN HCL IN DEXTROSE 1-5 GM/200ML-% IV SOLN: 1000.0000 mg | Freq: Once | INTRAVENOUS | Status: DC

## 2017-03-01 NOTE — ED Notes (Signed)
Dr.Tegeler at bedside to evaluate pt.

## 2017-03-01 NOTE — ED Triage Notes (Signed)
Pt brought in from home via EMS  Pt states he has been sick for a couple weeks and has developed a headache  Stabbing pain, c/o neck pain  [facility] was given a steroid shot and given flexeril  Pt was seen again today at the same urgent care and was given a shot for pain  Pt was told if it got worse to go to the hospital  Pt has had a fever for past couple of weeks

## 2017-03-01 NOTE — ED Notes (Signed)
ED Provider at bedside. 

## 2017-03-01 NOTE — ED Notes (Signed)
Dr.Tegeler at bedside with pt.

## 2017-03-01 NOTE — Progress Notes (Signed)
A consult was received from an ED physician for rocephin/vancomycin and ampicillin per pharmacy dosing.  The patient's profile has been reviewed for ht/wt/allergies/indication/available labs.   A one time order has been placed for Rocephin 2 Gm, Vancomycin 2500 mg and ampicillin 2 Gm.  Further antibiotics/pharmacy consults should be ordered by admitting physician if indicated.                       Thank you, Lorenza EvangelistGreen, Lucill Mauck R 03/01/2017  9:39 PM

## 2017-03-01 NOTE — ED Provider Notes (Addendum)
[facility]-EMERGENCY DEPT Provider Note   CSN: [id number] Arrival date & time: 03/01/17  1927     History   Chief Complaint Chief Complaint  Patient presents with  . Headache    HPI Matthew Mejia is a 26 y.o. male.  The history is provided by the patient, a parent and medical records. No language interpreter was used.  Headache   This is a new problem. The current episode started more than 1 week ago (2 weeks). The problem occurs constantly. The problem has not changed since onset.The headache is associated with nothing. Pain location: all over. The quality of the pain is described as dull. The pain is moderate. The pain radiates to the left neck and right neck. Associated symptoms include a fever, malaise/fatigue and nausea. Pertinent negatives include no anorexia, no chest pressure, no near-syncope, no syncope, no shortness of breath and no vomiting. He has tried nothing for the symptoms. The treatment provided no relief.  Cough  This is a new problem. The problem occurs constantly. The problem has been resolved. The cough is non-productive. The maximum temperature recorded prior to his arrival was 102 to 102.9 F. Associated symptoms include chills, sweats and headaches. Pertinent negatives include no chest pain, no rhinorrhea, no sore throat, no shortness of breath and no wheezing. He has tried nothing for the symptoms. His past medical history does not include asthma.    Past Medical History:  Diagnosis Date  . Obesity     Patient Active Problem List   Diagnosis Date Noted  . Lumbar paraspinal muscle spasm 07/19/2012  . Bilateral hand pain 07/19/2012  . Morbid obesity (HCC) 07/19/2012  . Dislocation of elbow, lateral, right, closed 07/20/2011    Past Surgical History:  Procedure Laterality Date  . arm surgery  2013  . ELBOW SURGERY    . KNEE SURGERY  2006  . left knee sx 4-5 years ago         Home Medications    Prior to Admission  medications   Medication Sig Start Date End Date Taking? Authorizing Provider  amoxicillin-clavulanate (AUGMENTIN) 875-125 MG per tablet Take 1 tablet by mouth 2 (two) times daily. Patient not taking: Reported on 03/22/2014 08/17/13   Le, Thao P, DO  benzonatate (TESSALON) 100 MG capsule Take 2 capsules (200 mg total) by mouth 2 (two) times daily as needed for cough. Patient not taking: Reported on 03/22/2014 08/17/13   Le, Thao P, DO  cyclobenzaprine (FLEXERIL) 5 MG tablet Take 1 tablet (5 mg total) by mouth at bedtime as needed for muscle spasms. Patient not taking: Reported on 03/22/2014 07/19/12   Sheliah Hatch, MD  HYDROcodone-acetaminophen [facility]) 5-325 MG per tablet Take 1-2 tablets by mouth every 4 (four) hours as needed. 03/22/14   Carmelina Dane, MD  HYDROcodone-homatropine [facility]) 5-1.5 MG/5ML syrup Take 5 mLs by mouth at bedtime as needed for cough. Patient not taking: Reported on 03/22/2014 08/17/13   Le, Thao P, DO  meloxicam (MOBIC) 15 MG tablet Take 1 tablet (15 mg total) by mouth daily. Patient not taking: Reported on 03/22/2014 07/19/12   Sheliah Hatch, MD    Family History Family History  Problem Relation Age of Onset  . Diabetes Mother   . Heart disease Mother   . Kidney disease Mother   . Hypertension Mother   . Hyperlipidemia Mother   . Hypertension Father     Social History Social History   Tobacco Use  . Smoking  status: Current Some Day Smoker    Packs/day: 0.00    Years: 0.00    Pack years: 0.00    Types: Cigars  . Smokeless tobacco: Never Used  Substance Use Topics  . Alcohol use: Yes    Alcohol/week: 1.2 oz    Types: 2 Cans of beer per week  . Drug use: No     Allergies   Patient has no known allergies.   Review of Systems Review of Systems  Constitutional: Positive for chills, fatigue, fever and malaise/fatigue. Negative for diaphoresis.  HENT: Negative for congestion, mouth sores, rhinorrhea and sore throat.   Eyes: Positive for  visual disturbance (blurry vision).  Respiratory: Positive for cough. Negative for choking, chest tightness, shortness of breath, wheezing and stridor.   Cardiovascular: Negative for chest pain, syncope and near-syncope.  Gastrointestinal: Positive for nausea. Negative for abdominal pain, anorexia, blood in stool, constipation, diarrhea and vomiting.  Genitourinary: Positive for decreased urine volume. Negative for dysuria, flank pain and frequency.  Musculoskeletal: Positive for neck pain and neck stiffness. Negative for back pain.  Skin: Negative for pallor, rash and wound.  Neurological: Positive for light-headedness and headaches. Negative for dizziness, seizures, speech difficulty, weakness and numbness.  Psychiatric/Behavioral: Negative for agitation.  All other systems reviewed and are negative.    Physical Exam Updated Vital Signs BP (!) 145/84 (BP Location: Right Arm)   Pulse 92   Temp (!) 102.7 F (39.3 C) (Oral)   Resp 18   Ht 6\' 1"  (1.854 m)   Wt (!) 154.2 kg (340 lb)   SpO2 100%   BMI 44.86 kg/m   Physical Exam  Constitutional: He appears well-developed and well-nourished. No distress.  HENT:  Head: Normocephalic and atraumatic.  Mouth/Throat: Oropharynx is clear and moist.  Eyes: Conjunctivae and EOM are normal. Pupils are equal, round, and reactive to light. Right pupil is round and reactive. Left pupil is round and reactive. Pupils are equal.  Neck: Muscular tenderness present. Neck rigidity present. No edema and no erythema present.  Patient reports pain with neck flexion.  Patient can move his neck side to side and backwards.  Patient has some tenderness in the paraspinal areas of the back of the neck.  Cardiovascular: Normal heart sounds.  No murmur heard. Pulmonary/Chest: Effort normal and breath sounds normal. No stridor. No respiratory distress. He has no wheezes. He exhibits no tenderness.  Abdominal: Soft. Bowel sounds are normal. He exhibits no  distension. There is no tenderness.  Musculoskeletal: He exhibits no edema or tenderness.  Neurological: He has normal strength. He is not disoriented. He displays no tremor. No cranial nerve deficit or sensory deficit. He exhibits normal muscle tone. Coordination normal. GCS eye subscore is 4. GCS verbal subscore is 5. GCS motor subscore is 6.  Patient has no focal neurologic deficits on my initial exam.  Patient had normal extraocular movements.  Normal pupils.  No facial droop.  Normal strength in arms and legs.  Normal sensation throughout.  Is somnolent but is arousable with voice.  Skin: Skin is warm. Capillary refill takes less than 2 seconds. He is not diaphoretic. No erythema. No pallor.  Psychiatric: His mood appears not anxious.  Nursing note and vitals reviewed.    ED Treatments / Results  Labs (all labs ordered are listed, but only abnormal results are displayed) Labs Reviewed  COMPREHENSIVE METABOLIC PANEL - Abnormal; Notable for the following components:      Result Value   Creatinine, Ser 1.40 (*)  Calcium 8.5 (*)    All other components within normal limits  URINALYSIS, ROUTINE W REFLEX MICROSCOPIC - Abnormal; Notable for the following components:   Color, Urine AMBER (*)    Specific Gravity, Urine 1.032 (*)    Ketones, ur 5 (*)    Leukocytes, UA TRACE (*)    All other components within normal limits  BASIC METABOLIC PANEL - Abnormal; Notable for the following components:   Creatinine, Ser 1.25 (*)    Calcium 7.6 (*)    All other components within normal limits  CSF CELL COUNT WITH DIFFERENTIAL - Abnormal; Notable for the following components:   Appearance, CSF CLEAR (*)    RBC Count, CSF 2 (*)    WBC, CSF 70 (*)    Segmented Neutrophils-CSF 65 (*)    Lymphs, CSF 20 (*)    All other components within normal limits  CSF CULTURE  CULTURE, BLOOD (ROUTINE X 2)  CULTURE, BLOOD (ROUTINE X 2)  URINE CULTURE  CULTURE, FUNGUS WITHOUT SMEAR  CBC WITH  DIFFERENTIAL/PLATELET  INFLUENZA PANEL BY PCR (TYPE A & B)  HIV ANTIBODY (ROUTINE TESTING)  PROCALCITONIN  CBC  GLUCOSE, CSF  PROTEIN, CSF  PATHOLOGIST SMEAR REVIEW  CRYPTOCOCCAL ANTIGEN, CSF  HERPES SIMPLEX VIRUS(HSV) DNA BY PCR  RPR  I-STAT CG4 LACTIC ACID, ED  I-STAT CG4 LACTIC ACID, ED    EKG  EKG Interpretation  Date/Time:  Sunday March 01 2017 21:34:04 EST Ventricular Rate:  88 PR Interval:    QRS Duration: 86 QT Interval:  324 QTC Calculation: 392 R Axis:   39 Text Interpretation:  Sinus rhythm When compared to prior, no signifnicant changes seen.  No STEMI Confirmed by Theda Belfastegeler, Chris ([id number]) on 03/01/2017 11:15:22 PM Also confirmed by Theda Belfastegeler, Chris ([id number]), editor Madalyn RobEverhart, Marilyn [phone])  on 03/02/2017 8:25:37 AM       Radiology Dg Chest 2 View  Result Date: 03/01/2017 CLINICAL DATA:  Fevers EXAM: CHEST  2 VIEW COMPARISON:  07/04/2012 FINDINGS: The overall inspiratory effort is poor with crowding of the vascular markings. No acute infiltrate or sizable effusion is seen. Cardiac shadow is accentuated by the frontal technique. No bony abnormality is noted. IMPRESSION: Poor inspiratory effort without acute abnormality. Electronically Signed   By: Alcide CleverMark  Lukens M.D.   On: 03/01/2017 21:13   Ct Head Wo Contrast  Result Date: 03/01/2017 CLINICAL DATA:  Initial evaluation for acute altered mental status, headache. EXAM: CT HEAD WITHOUT CONTRAST TECHNIQUE: Contiguous axial images were obtained from the base of the skull through the vertex without intravenous contrast. COMPARISON:  Prior CT from 11/01/2005. FINDINGS: Brain: Cerebral volume within normal limits for patient age. No evidence for acute intracranial hemorrhage. No findings to suggest acute large vessel territory infarct. No mass lesion, midline shift, or mass effect. Ventricles are normal in size without evidence for hydrocephalus. No extra-axial fluid collection identified. Vascular: No hyperdense vessel  identified. Skull: Scalp soft tissues demonstrate no acute abnormality.Calvarium intact. Sinuses/Orbits: Globes and orbital soft tissues are within normal limits. Visualized paranasal sinuses are clear. No mastoid effusion. IMPRESSION: Normal head CT.  No acute intracranial abnormality. Electronically Signed   By: Rise MuBenjamin  McClintock M.D.   On: 03/01/2017 22:47   Dg Fluoro Guide Lumbar Puncture  Result Date: 03/02/2017 CLINICAL DATA:  Intractable headache EXAM: DIAGNOSTIC LUMBAR PUNCTURE UNDER FLUOROSCOPIC GUIDANCE FLUOROSCOPY TIME:  Fluoroscopy Time:  24 seconds Radiation Exposure Index (if provided by the fluoroscopic device): 5.5 mGy Number of Acquired Spot Images: 1 PROCEDURE: Informed consent was obtained  from the patient prior to the procedure, including potential complications of headache, allergy, and pain. With the patient prone, the lower back was prepped with Betadine. 1% Lidocaine was used for local anesthesia. Lumbar puncture was performed at the L3-4 level using a 22 gauge needle with return of clear CSF. 9 ml of CSF were obtained for laboratory studies. The patient tolerated the procedure well and there were no apparent complications. IMPRESSION: Successful fluoroscopic guided lumbar puncture, as described above. Electronically Signed   By: Charline Bills M.D.   On: 03/02/2017 11:19    Procedures .Lumbar Puncture Date/Time: 03/02/2017 1:42 PM Performed by: Heide Scales, MD Authorized by: Heide Scales, MD   Consent:    Consent obtained:  Written   Consent given by:  Patient   Risks discussed:  Infection, pain, repeat procedure, nerve damage, headache and bleeding   Alternatives discussed:  No treatment and delayed treatment Pre-procedure details:    Procedure purpose:  Diagnostic   Preparation: Patient was prepped and draped in usual sterile fashion   Anesthesia (see MAR for exact dosages):    Anesthesia method:  Local infiltration   Local anesthetic:   Lidocaine 1% w/o epi Procedure details:    Lumbar space:  L4-L5 interspace   Patient position:  Sitting   Needle gauge:  22   Needle type:  Spinal needle - Quincke tip   Needle length (in):  5.0   Ultrasound guidance: no     Number of attempts:  1 Post-procedure:    Patient tolerance of procedure:  Tolerated well, no immediate complications Comments:     Unsuccessful LP attempt.    (including critical care time)  Medications Ordered in ED Medications  acetaminophen (TYLENOL) tablet 650 mg (650 mg Oral Given 03/02/17 1202)    Or  acetaminophen (TYLENOL) suppository 650 mg ( Rectal See Alternative 03/02/17 1202)  ondansetron (ZOFRAN) tablet 4 mg (not administered)    Or  ondansetron (ZOFRAN) injection 4 mg (not administered)  lidocaine (XYLOCAINE) 1 % (with pres) injection (not administered)  ketorolac (TORADOL) 30 MG/ML injection 30 mg (not administered)  0.9 %  sodium chloride infusion (not administered)  oxyCODONE-acetaminophen (PERCOCET/ROXICET) 5-325 MG per tablet 1-2 tablet (not administered)  prochlorperazine (COMPAZINE) injection 10 mg (not administered)  sodium chloride 0.9 % bolus 1,000 mL (0 mLs Intravenous Stopped 03/01/17 2230)    And  sodium chloride 0.9 % bolus 1,000 mL (0 mLs Intravenous Stopped 03/01/17 2300)    And  sodium chloride 0.9 % bolus 1,000 mL (0 mLs Intravenous Stopped 03/02/17 0031)    And  sodium chloride 0.9 % bolus 1,000 mL (0 mLs Intravenous Stopped 03/01/17 2356)    And  sodium chloride 0.9 % bolus 1,000 mL (0 mLs Intravenous Stopped 03/02/17 0048)  acetaminophen (TYLENOL) tablet 650 mg (650 mg Oral Given 03/01/17 2221)  lidocaine (XYLOCAINE) 1 % (with pres) injection (20 mLs  Given 03/02/17 0040)     Initial Impression / Assessment and Plan / ED Course  I have reviewed the triage vital signs and the nursing notes.  Pertinent labs & imaging results that were available during my care of the patient were reviewed by me and considered in my  medical decision making (see chart for details).     Matthew Mejia is a 26 y.o. male with a past medical history significant for morbid obesity who presents with multiple complaints including fevers, chills, headache, neck pain, nausea, blurry vision, dry cough, decreased urination, and sleepiness.  Patient  is coming by his father.  Patient's father and patient report that for the last 2 weeks, patient has not been having gradually worsening symptoms.  He reports having headache and neck pain for which she saw an urgent care several days ago.  He says that he has been given to injections of steroids and pain medicine without significant relief of symptoms.  He says that tonight he continued to have symptoms prompting him to seek evaluation.  He reports having a moderate to severe headache across his head.  He reports he is having neck pain and neck stiffness.  He denies any numbness, tingling, or weakness of extremities but has been having some mild blurry vision.  He denies diplopia.  He reports nausea but no vomiting.  He reports having a dry cough several days ago that has improved.  He reports having no sore throat.  He reports his urine has been decreased as he is not eating and drinking as much.  He denies dysuria.  He denies any constipation or diarrhea.  He denies low back pain abdominal pain or chest pain.  His father says that he is been sleeping all day and is somnolent.  Patient has not had this constellation of symptoms in the past.  On exam, patient had normal extraocular movements.  Pupils were reactive bilaterally.  Patient had pain with flexion of the neck.  Patient was able to move his neck side to side and extend it without significant pain.  Lungs were clear.  Chest was nontender.  Back was nontender.  No CVA tenderness.  Abdomen was nontender.  Patient could move all extremities and had normal sensation in all extremities.  Normal sensation bilaterally in the face.  Patient was somnolent  but arousable to voice.  Patient had vital signs collected when he felt warm and had a temperature over 102.  Patient also had a heart rate during my exam of just greater than 100.  Patient was tachypneic.  Given the symptoms, code sepsis will be called.  Patient started on fluids.  Patient had an influenza test obtained which was negative.  Due to the patient's symptoms of somnolence, headache, neck stiffness, and fevers, meningitis has not yet been ruled out.  With the blurry vision, a CT of the head was obtained prior to lumbar puncture attempted.  CT of the head showed no acute abnormality.  Patient's laboratory testing also began to return showing normal lactic acid, normal white blood cell count, no infection in the urine, and CMP overall reassuring aside from slightly creatinine of 1.4.  Fluids were given.  Blood cultures were collected.  Given patient's symptoms and otherwise negative flu testing, patient will likely need lumbar puncture followed by antibiotics and admission.  11:36 PM Patient was reassessed and he continued to have headaches and somnolence.  Patient says that he wants the lumbar puncture.  Patient will have lumbar puncture attempted and will likelt be given antibiotics after.  Patient will require admission for further management.  LP unsuccessful.   Hospitlaist called for admission for further management. Hospitalist requested holding antibiotics until LP can be preformed.   PT admitted.    Final Clinical Impressions(s) / ED Diagnoses   Final diagnoses:  Acute intractable headache, unspecified headache type  Fever, unspecified fever cause  Somnolence  Neck pain    ED Discharge Orders    None      Clinical Impression: 1. Acute intractable headache, unspecified headache type   2. Fever, unspecified fever cause  3. Somnolence   4. Neck pain   5. Headache     Disposition: Admit to hospitalist service    Tegeler, Canary Brim, MD 03/02/17  1343    Tegeler, Canary Brim, MD 03/02/17 1345

## 2017-03-01 NOTE — ED Notes (Signed)
Pt reports improvement in symptoms at this time along with decreasing pain to 8/10 from 10/10

## 2017-03-01 NOTE — ED Notes (Signed)
Pt reports having headache for the last 2 weeks with fever that developed today. Pt did not display any sensitivity to light on exam. And was laying in bed in semi-fowlers position with no acute distress.

## 2017-03-01 NOTE — ED Notes (Signed)
Initial presentation of pt laying on right side in fetal position.

## 2017-03-02 ENCOUNTER — Observation Stay (HOSPITAL_COMMUNITY): Payer: BLUE CROSS/BLUE SHIELD

## 2017-03-02 DIAGNOSIS — R519 Headache, unspecified: Secondary | ICD-10-CM | POA: Diagnosis present

## 2017-03-02 DIAGNOSIS — R51 Headache: Secondary | ICD-10-CM

## 2017-03-02 DIAGNOSIS — R509 Fever, unspecified: Secondary | ICD-10-CM | POA: Diagnosis present

## 2017-03-02 DIAGNOSIS — M542 Cervicalgia: Secondary | ICD-10-CM | POA: Diagnosis not present

## 2017-03-02 DIAGNOSIS — G030 Nonpyogenic meningitis: Secondary | ICD-10-CM

## 2017-03-02 LAB — BASIC METABOLIC PANEL
ANION GAP: 7 (ref 5–15)
BUN: 13 mg/dL (ref 6–20)
CALCIUM: 7.6 mg/dL — AB (ref 8.9–10.3)
CO2: 23 mmol/L (ref 22–32)
Chloride: 109 mmol/L (ref 101–111)
Creatinine, Ser: 1.25 mg/dL — ABNORMAL HIGH (ref 0.61–1.24)
Glucose, Bld: 96 mg/dL (ref 65–99)
Potassium: 3.8 mmol/L (ref 3.5–5.1)
SODIUM: 139 mmol/L (ref 135–145)

## 2017-03-02 LAB — CBC
HCT: 40.5 % (ref 39.0–52.0)
Hemoglobin: 13.5 g/dL (ref 13.0–17.0)
MCH: 29.4 pg (ref 26.0–34.0)
MCHC: 33.3 g/dL (ref 30.0–36.0)
MCV: 88.2 fL (ref 78.0–100.0)
Platelets: 245 10*3/uL (ref 150–400)
RBC: 4.59 MIL/uL (ref 4.22–5.81)
RDW: 14.2 % (ref 11.5–15.5)
WBC: 8.4 10*3/uL (ref 4.0–10.5)

## 2017-03-02 LAB — CSF CELL COUNT WITH DIFFERENTIAL
Lymphs, CSF: 20 % — ABNORMAL LOW (ref 40–80)
MONOCYTE-MACROPHAGE-SPINAL FLUID: 16 % (ref 15–45)
RBC Count, CSF: 2 /mm3 — ABNORMAL HIGH
Segmented Neutrophils-CSF: 65 % — ABNORMAL HIGH (ref 0–6)
TUBE #: 4
WBC, CSF: 70 /mm3 (ref 0–5)

## 2017-03-02 LAB — GLUCOSE, CSF: GLUCOSE CSF: 55 mg/dL (ref 40–70)

## 2017-03-02 LAB — PATHOLOGIST SMEAR REVIEW

## 2017-03-02 LAB — PROCALCITONIN

## 2017-03-02 LAB — PROTEIN, CSF: TOTAL PROTEIN, CSF: 39 mg/dL (ref 15–45)

## 2017-03-02 LAB — CRYPTOCOCCAL ANTIGEN, CSF: Crypto Ag: NEGATIVE

## 2017-03-02 LAB — HIV ANTIBODY (ROUTINE TESTING W REFLEX): HIV Screen 4th Generation wRfx: NONREACTIVE

## 2017-03-02 MED ORDER — OXYCODONE-ACETAMINOPHEN 5-325 MG PO TABS: 1.0000 | ORAL_TABLET | ORAL | Status: DC | PRN

## 2017-03-02 MED ORDER — ACETAMINOPHEN 325 MG PO TABS
650.0000 mg | ORAL_TABLET | Freq: Four times a day (QID) | ORAL | Status: DC | PRN
  Administered 2017-03-02 (×2): 650 mg via ORAL
  Filled 2017-03-02 (×2): qty 2

## 2017-03-02 MED ORDER — LIDOCAINE HCL 1 % IJ SOLN
INTRAMUSCULAR | Status: AC
  Filled 2017-03-02: qty 20

## 2017-03-02 MED ORDER — ACETAMINOPHEN 650 MG RE SUPP: 650.0000 mg | Freq: Four times a day (QID) | RECTAL | Status: DC | PRN

## 2017-03-02 MED ORDER — KETOROLAC TROMETHAMINE 30 MG/ML IJ SOLN
30.0000 mg | Freq: Four times a day (QID) | INTRAMUSCULAR | Status: DC | PRN
  Administered 2017-03-02: 30 mg via INTRAVENOUS
  Filled 2017-03-02: qty 1

## 2017-03-02 MED ORDER — ONDANSETRON HCL 4 MG PO TABS: 4.0000 mg | ORAL_TABLET | Freq: Four times a day (QID) | ORAL | Status: DC | PRN

## 2017-03-02 MED ORDER — SODIUM CHLORIDE 0.9 % IV SOLN
INTRAVENOUS | Status: DC
  Administered 2017-03-02 – 2017-03-03 (×3): via INTRAVENOUS

## 2017-03-02 MED ORDER — PROCHLORPERAZINE EDISYLATE 5 MG/ML IJ SOLN: 10.0000 mg | Freq: Four times a day (QID) | INTRAMUSCULAR | Status: DC | PRN

## 2017-03-02 MED ORDER — ONDANSETRON HCL 4 MG/2ML IJ SOLN: 4.0000 mg | Freq: Four times a day (QID) | INTRAMUSCULAR | Status: DC | PRN

## 2017-03-02 NOTE — ED Notes (Signed)
Time out for Lumbar puncture done at 0039

## 2017-03-02 NOTE — Progress Notes (Signed)
I have seen and assessed patient and I agree with Dr.Gardner's assessment and plan.  Patient is a 26 year old obese gentleman presented with a 2-week history of headache, fevers, mild neck stiffness.  Blood puncture attempted in the emergency department was unsuccessful.  Patient underwent fluoroscopic guided LP by interventional radiology and fluid results pointing towards an aseptic meningitis.  HIV pending.  Check a HSV PCR to CSF fluids.  Check RPR.  Placed on IV fluids, supportive care.  Case discussed with ID.  Follow.  No charge.

## 2017-03-02 NOTE — ED Notes (Signed)
Pt. Documented in error document vital signs within 1-hour of fluid bolus completion and notify provider of bolus completion.

## 2017-03-02 NOTE — ED Notes (Signed)
Dr.Gardner at bedside to evaluate pt.  

## 2017-03-02 NOTE — ED Notes (Signed)
Lumbar puncture procedure started at 0043

## 2017-03-02 NOTE — ED Notes (Signed)
ED TO INPATIENT HANDOFF REPORT  Name/Age/Gender Matthew Mejia 26 y.o. male  Code Status    Code Status Orders  (From admission, onward)        Start     Ordered   03/02/17 0210  Full code  Continuous     03/02/17 0214    Code Status History    Date Active Date Inactive Code Status Order ID Comments User Context   This patient has a current code status but no historical code status.      Home/SNF/Other HOME  Chief Complaint headache   Level of Care/Admitting Diagnosis ED Disposition    ED Disposition Condition [facility] Area: [facility] [[id number]]  Level of Care: Med-Surg [16]  Diagnosis: Fever [476546]  Admitting Physician: Etta Quill [[id number]]  Attending Physician: Etta Quill [[id number]]  PT Class (Do Not Modify): Observation [104]  PT Acc Code (Do Not Modify): Observation [10022]       Medical History Past Medical History:  Diagnosis Date  . Obesity     Allergies No Known Allergies  IV Location/Drains/Wounds Patient Lines/Drains/Airways Status   Active Line/Drains/Airways    Name:   Placement date:   Placement time:   Site:   Days:   Peripheral IV 03/01/17 Left Antecubital   03/01/17    2057    Antecubital   1   Peripheral IV 03/01/17 Left Forearm   03/01/17    2202    Forearm   1          Labs/Imaging Results for orders placed or performed during the hospital encounter of 03/01/17 (from the past 48 hour(s))  Comprehensive metabolic panel     Status: Abnormal   Collection Time: 03/01/17  8:51 PM  Result Value Ref Range   Sodium 138 135 - 145 mmol/L   Potassium 3.5 3.5 - 5.1 mmol/L   Chloride 102 101 - 111 mmol/L   CO2 26 22 - 32 mmol/L   Glucose, Bld 95 65 - 99 mg/dL   BUN 15 6 - 20 mg/dL   Creatinine, Ser 1.40 (H) 0.61 - 1.24 mg/dL   Calcium 8.5 (L) 8.9 - 10.3 mg/dL   Total Protein 7.3 6.5 - 8.1 g/dL   Albumin 3.9 3.5 - 5.0 g/dL   AST 32 15 - 41 U/L   ALT 35 17 - 63 U/L   Alkaline Phosphatase  51 38 - 126 U/L   Total Bilirubin 0.6 0.3 - 1.2 mg/dL   GFR calc non Af Amer >60 >60 mL/min   GFR calc Af Amer >60 >60 mL/min    Comment: (NOTE) The eGFR has been calculated using the CKD EPI equation. This calculation has not been validated in all clinical situations. eGFR's persistently <60 mL/min signify possible Chronic Kidney Disease.    Anion gap 10 5 - 15  CBC with Differential     Status: None   Collection Time: 03/01/17  8:51 PM  Result Value Ref Range   WBC 9.3 4.0 - 10.5 K/uL   RBC 4.82 4.22 - 5.81 MIL/uL   Hemoglobin 14.4 13.0 - 17.0 g/dL   HCT 42.4 39.0 - 52.0 %   MCV 88.0 78.0 - 100.0 fL   MCH 29.9 26.0 - 34.0 pg   MCHC 34.0 30.0 - 36.0 g/dL   RDW 14.0 11.5 - 15.5 %   Platelets 259 150 - 400 K/uL   Neutrophils Relative % 76 %   Neutro Abs 7.0  1.7 - 7.7 K/uL   Lymphocytes Relative 20 %   Lymphs Abs 1.9 0.7 - 4.0 K/uL   Monocytes Relative 4 %   Monocytes Absolute 0.4 0.1 - 1.0 K/uL   Eosinophils Relative 0 %   Eosinophils Absolute 0.0 0.0 - 0.7 K/uL   Basophils Relative 0 %   Basophils Absolute 0.0 0.0 - 0.1 K/uL  I-Stat CG4 Lactic Acid, ED     Status: None   Collection Time: 03/01/17  9:07 PM  Result Value Ref Range   Lactic Acid, Venous 1.14 0.5 - 1.9 mmol/L  Influenza panel by PCR (type A & B)     Status: None   Collection Time: 03/01/17  9:45 PM  Result Value Ref Range   Influenza A By PCR NEGATIVE NEGATIVE   Influenza B By PCR NEGATIVE NEGATIVE    Comment: (NOTE) The Xpert Xpress Flu assay is intended as an aid in the diagnosis of  influenza and should not be used as a sole basis for treatment.  This  assay is FDA approved for nasopharyngeal swab specimens only. Nasal  washings and aspirates are unacceptable for Xpert Xpress Flu testing.   Urinalysis, Routine w reflex microscopic     Status: Abnormal   Collection Time: 03/01/17 10:03 PM  Result Value Ref Range   Color, Urine AMBER (A) YELLOW    Comment: BIOCHEMICALS MAY BE AFFECTED BY COLOR    APPearance CLEAR CLEAR   Specific Gravity, Urine 1.032 (H) 1.005 - 1.030   pH 5.0 5.0 - 8.0   Glucose, UA NEGATIVE NEGATIVE mg/dL   Hgb urine dipstick NEGATIVE NEGATIVE   Bilirubin Urine NEGATIVE NEGATIVE   Ketones, ur 5 (A) NEGATIVE mg/dL   Protein, ur NEGATIVE NEGATIVE mg/dL   Nitrite NEGATIVE NEGATIVE   Leukocytes, UA TRACE (A) NEGATIVE   RBC / HPF 0-5 0 - 5 RBC/hpf   WBC, UA 6-30 0 - 5 WBC/hpf   Bacteria, UA NONE SEEN NONE SEEN   Squamous Epithelial / LPF NONE SEEN NONE SEEN   Mucus PRESENT    Hyaline Casts, UA PRESENT    Dg Chest 2 View  Result Date: 03/01/2017 CLINICAL DATA:  Fevers EXAM: CHEST  2 VIEW COMPARISON:  07/04/2012 FINDINGS: The overall inspiratory effort is poor with crowding of the vascular markings. No acute infiltrate or sizable effusion is seen. Cardiac shadow is accentuated by the frontal technique. No bony abnormality is noted. IMPRESSION: Poor inspiratory effort without acute abnormality. Electronically Signed   By: Inez Catalina M.D.   On: 03/01/2017 21:13   Ct Head Wo Contrast  Result Date: 03/01/2017 CLINICAL DATA:  Initial evaluation for acute altered mental status, headache. EXAM: CT HEAD WITHOUT CONTRAST TECHNIQUE: Contiguous axial images were obtained from the base of the skull through the vertex without intravenous contrast. COMPARISON:  Prior CT from 11/01/2005. FINDINGS: Brain: Cerebral volume within normal limits for patient age. No evidence for acute intracranial hemorrhage. No findings to suggest acute large vessel territory infarct. No mass lesion, midline shift, or mass effect. Ventricles are normal in size without evidence for hydrocephalus. No extra-axial fluid collection identified. Vascular: No hyperdense vessel identified. Skull: Scalp soft tissues demonstrate no acute abnormality.Calvarium intact. Sinuses/Orbits: Globes and orbital soft tissues are within normal limits. Visualized paranasal sinuses are clear. No mastoid effusion. IMPRESSION:  Normal head CT.  No acute intracranial abnormality. Electronically Signed   By: Jeannine Boga M.D.   On: 03/01/2017 22:47    Pending Labs Unresulted Labs (From admission, onward)  Start     Ordered   03/03/17 0500  Procalcitonin  Daily,   R     03/02/17 0203   03/02/17 0500  CBC  Tomorrow morning,   R     03/02/17 0214   03/02/17 7412  Basic metabolic panel  Tomorrow morning,   R     03/02/17 0214   03/02/17 0203  Cryptococcal antigen, CSF  Once,   R     03/02/17 0202   03/02/17 0203  Procalcitonin - Baseline  STAT,   STAT     03/02/17 0203   03/01/17 2127  HIV antibody  (Meningitis Panel)  Once,   R     03/01/17 2127   03/01/17 2122  Urine culture  STAT,   STAT     03/01/17 2126   03/01/17 2121  Blood Culture (routine x 2)  BLOOD CULTURE X 2,   STAT     03/01/17 2126      Vitals/Pain Today's Vitals   03/02/17 0100 03/02/17 0115 03/02/17 0130 03/02/17 0205  BP: 122/63 (!) 123/55 (!) 118/59 (!) 169/82  Pulse: 81 88 83 73  Resp: (!) 22 17 19  (!) 21  Temp:      TempSrc:      SpO2: 99% 100% 99% 99%  Weight:      Height:      PainSc:        Isolation Precautions Droplet precaution  Medications Medications  acetaminophen (TYLENOL) tablet 650 mg (not administered)    Or  acetaminophen (TYLENOL) suppository 650 mg (not administered)  ondansetron (ZOFRAN) tablet 4 mg (not administered)    Or  ondansetron (ZOFRAN) injection 4 mg (not administered)  sodium chloride 0.9 % bolus 1,000 mL (0 mLs Intravenous Stopped 03/01/17 2230)    And  sodium chloride 0.9 % bolus 1,000 mL (0 mLs Intravenous Stopped 03/01/17 2300)    And  sodium chloride 0.9 % bolus 1,000 mL (0 mLs Intravenous Stopped 03/02/17 0031)    And  sodium chloride 0.9 % bolus 1,000 mL (0 mLs Intravenous Stopped 03/01/17 2356)    And  sodium chloride 0.9 % bolus 1,000 mL (0 mLs Intravenous Stopped 03/02/17 0048)  acetaminophen (TYLENOL) tablet 650 mg (650 mg Oral Given 03/01/17 2221)  lidocaine  (XYLOCAINE) 1 % (with pres) injection (20 mLs  Given 03/02/17 0040)    Mobility WALKS

## 2017-03-02 NOTE — Care Management Note (Signed)
Case Management Note  Patient Details  Name: Matthew Mejia MRN: [id number] Date of Birth: Jan 20, 1991  Subjective/Objective:                  headache  Action/Plan: Date: March 02, 2017 Matthew Mejia, BSN, [place]3, [place]  [phone] Chart and notes review for patient progress and needs. Will follow for case management and discharge needs. Next review date: [id number]  Expected Discharge Date:                  Expected Discharge Plan:  Home/Self Care  In-House Referral:     Discharge planning Services  CM Consult  Post Acute Care Choice:    Choice offered to:     DME Arranged:    DME Agency:     HH Arranged:    HH Agency:     Status of Service:  In process, will continue to follow  If discussed at Long Length of Stay Meetings, dates discussed:    Additional Comments:  Matthew Mejia, Matthew Lynn, RN 03/02/2017, 9:23 AM

## 2017-03-02 NOTE — H&P (Signed)
History and Physical    Matthew Mejia [id number] DOB: 15-Sep-1990 DOA: 03/01/2017  PCP: Patient, No Pcp Per  Patient coming from: Home  I have personally briefly reviewed patient[facility]  Chief Complaint: Headache  HPI: Matthew RollsGeorge B Hartig is a 26 y.o. male with medical history significant of obesity.  Patient presents to the ED with c/o headache.  Headache onset ~2 weeks ago and has been persistent since onset.  Headache is dull and moderate.  There is radiation to neck.  Patient has mild neck stiffness but can touch chin to chest.  This has not changed since onset.  I asked at least 3 to 4 different ways, this has definitely been going on for ~2 weeks (not new in past 24-48 hours, no changes, etc).  [facility] given single shot of a "steroid" and started on Augmentin (not quite sure what their thought process was).  Symptoms persisted and he presents to ED today.   ED Course: Tm 102.7 in ED, sleepy but AAOx3.  EDP gave 5L NS bolus initially thinking patient was septic, was going to give ABx but held off. LP attempted but not able to be performed successfully.  NL WBC, initially HR 105, improved to 80s-90s post IVF.  ABx, not initiated in ED.  Influenza pnl negative.   Review of Systems: As per HPI otherwise 10 point review of systems negative.   Past Medical History:  Diagnosis Date  . Obesity     Past Surgical History:  Procedure Laterality Date  . arm surgery  2013  . ELBOW SURGERY    . KNEE SURGERY  2006  . left knee sx 4-5 years ago       reports that he has been smoking cigars.  He has been smoking about 0.00 packs per day for the past 0.00 years. he has never used smokeless tobacco. He reports that he drinks about 1.2 oz of alcohol per week. He reports that he does not use drugs.  No Known Allergies  Family History  Problem Relation Age of Onset  . Diabetes Mother   . Heart disease Mother   . Kidney disease  Mother   . Hypertension Mother   . Hyperlipidemia Mother   . Hypertension Father      Prior to Admission medications   Medication Sig Start Date End Date Taking? Authorizing Provider  amoxicillin-clavulanate (AUGMENTIN) 875-125 MG per tablet Take 1 tablet by mouth 2 (two) times daily. 08/17/13  Yes Lenell AntuLe, Thao P, DO    Physical Exam: Vitals:   03/02/17 0100 03/02/17 0115 03/02/17 0130 03/02/17 0205  BP: 122/63 (!) 123/55 (!) 118/59 (!) 169/82  Pulse: 81 88 83 73  Resp: (!) 22 17 19  (!) 21  Temp:      TempSrc:      SpO2: 99% 100% 99% 99%  Weight:      Height:        Constitutional: NAD, calm, comfortable Eyes: PERRL, lids and conjunctivae normal ENMT: Mucous membranes are moist. Posterior pharynx clear of any exudate or lesions.Normal dentition.  Neck: Supple: on my exam the patient is able to touch chin to chest without severe pain or significant worsening of headache or pain Respiratory: clear to auscultation bilaterally, no wheezing, no crackles. Normal respiratory effort. No accessory muscle use.  Cardiovascular: Regular rate and rhythm, no murmurs / rubs / gallops. No extremity edema. 2+ pedal pulses. No carotid bruits.  Abdomen: no  tenderness, no masses palpated. No hepatosplenomegaly. Bowel sounds positive.  Musculoskeletal: no clubbing / cyanosis. No joint deformity upper and lower extremities. Good ROM, no contractures. Normal muscle tone.  Skin: no rashes, lesions, ulcers. No induration Neurologic: CN 2-12 grossly intact. Sensation intact, DTR normal. Strength 5/5 in all 4.  Psychiatric: Normal judgment and insight. Alert and oriented x 3. Normal mood.    Labs on Admission: I have personally reviewed following labs and imaging studies  CBC: Recent Labs  Lab 03/01/17 2051  WBC 9.3  NEUTROABS 7.0  HGB 14.4  HCT 42.4  MCV 88.0  PLT 259   Basic Metabolic Panel: Recent Labs  Lab 03/01/17 2051  NA 138  K 3.5  CL 102  CO2 26  GLUCOSE 95  BUN 15  CREATININE  1.40*  CALCIUM 8.5*   GFR: Estimated Creatinine Clearance: 122.9 mL/min (A) (by C-G formula based on SCr of 1.4 mg/dL (H)). Liver Function Tests: Recent Labs  Lab 03/01/17 2051  AST 32  ALT 35  ALKPHOS 51  BILITOT 0.6  PROT 7.3  ALBUMIN 3.9   No results for input(s): LIPASE, AMYLASE in the last 168 hours. No results for input(s): AMMONIA in the last 168 hours. Coagulation Profile: No results for input(s): INR, PROTIME in the last 168 hours. Cardiac Enzymes: No results for input(s): CKTOTAL, CKMB, CKMBINDEX, TROPONINI in the last 168 hours. BNP (last 3 results) No results for input(s): PROBNP in the last 8760 hours. HbA1C: No results for input(s): HGBA1C in the last 72 hours. CBG: No results for input(s): GLUCAP in the last 168 hours. Lipid Profile: No results for input(s): CHOL, HDL, LDLCALC, TRIG, CHOLHDL, LDLDIRECT in the last 72 hours. Thyroid Function Tests: No results for input(s): TSH, T4TOTAL, FREET4, T3FREE, THYROIDAB in the last 72 hours. Anemia Panel: No results for input(s): VITAMINB12, FOLATE, FERRITIN, TIBC, IRON, RETICCTPCT in the last 72 hours. Urine analysis:    Component Value Date/Time   COLORURINE AMBER (A) 03/01/2017 2203   APPEARANCEUR CLEAR 03/01/2017 2203   LABSPEC 1.032 (H) 03/01/2017 2203   PHURINE 5.0 03/01/2017 2203   GLUCOSEU NEGATIVE 03/01/2017 2203   HGBUR NEGATIVE 03/01/2017 2203   BILIRUBINUR NEGATIVE 03/01/2017 2203   KETONESUR 5 (A) 03/01/2017 2203   PROTEINUR NEGATIVE 03/01/2017 2203   NITRITE NEGATIVE 03/01/2017 2203   LEUKOCYTESUR TRACE (A) 03/01/2017 2203    Radiological Exams on Admission: Dg Chest 2 View  Result Date: 03/01/2017 CLINICAL DATA:  Fevers EXAM: CHEST  2 VIEW COMPARISON:  07/04/2012 FINDINGS: The overall inspiratory effort is poor with crowding of the vascular markings. No acute infiltrate or sizable effusion is seen. Cardiac shadow is accentuated by the frontal technique. No bony abnormality is noted.  IMPRESSION: Poor inspiratory effort without acute abnormality. Electronically Signed   By: Alcide CleverMark  Lukens M.D.   On: 03/01/2017 21:13   Ct Head Wo Contrast  Result Date: 03/01/2017 CLINICAL DATA:  Initial evaluation for acute altered mental status, headache. EXAM: CT HEAD WITHOUT CONTRAST TECHNIQUE: Contiguous axial images were obtained from the base of the skull through the vertex without intravenous contrast. COMPARISON:  Prior CT from 11/01/2005. FINDINGS: Brain: Cerebral volume within normal limits for patient age. No evidence for acute intracranial hemorrhage. No findings to suggest acute large vessel territory infarct. No mass lesion, midline shift, or mass effect. Ventricles are normal in size without evidence for hydrocephalus. No extra-axial fluid collection identified. Vascular: No hyperdense vessel identified. Skull: Scalp soft tissues demonstrate no acute abnormality.Calvarium intact. Sinuses/Orbits: Globes and  orbital soft tissues are within normal limits. Visualized paranasal sinuses are clear. No mastoid effusion. IMPRESSION: Normal head CT.  No acute intracranial abnormality. Electronically Signed   By: Rise Mu M.D.   On: 03/01/2017 22:47    EKG: Independently reviewed.  Assessment/Plan Principal Problem:   Headache Active Problems:   Fever    1. Headache and fever - 1. I do not think this patients presentation is consistent with acute bacterial meningitis. 1. 2 weeks of unchanging symptoms for a more sub-acute presentation 2. Not really having meningismus on my exam 3. Fever, no other SIRS 2. To double check that holding ABx was reasonable, I spoke with Dr. Daiva Eves, per him: 1. Not initiating ABx is reasonable 2. Add serum cryptococcus 3. procalcitonin 4. Further CNS imaging if work up neg 3. Will get LP by IR as well (EDP attempted and was unable) 4. BCx pending 5. Tylenol PRN fever  DVT prophylaxis: SCDs - LP planned Code Status: Full Family  Communication: Family at bedside Disposition Plan: Home after admit Consults called: Spoke with Dr. Gwen Her Dam as above Admission status: Place in [place], [place]. DO [facility] Pager [phone]  If 7AM-7PM, please contact day team taking care of patient [email or web address] Password [email or web address]  03/02/2017, 2:15 AM

## 2017-03-02 NOTE — Progress Notes (Addendum)
Lab called with results from gram stain WBC present, Both PMN and mononuclear cells present, no organism seen, Critical WBC count on CSF of 70. MD made aware.

## 2017-03-02 NOTE — ED Notes (Signed)
Lumbar puncture unsuccessful in obtaining specimines

## 2017-03-02 NOTE — ED Notes (Signed)
Per Dr. Julian ReilGardner the code sepsis is canceled.

## 2017-03-02 NOTE — Procedures (Signed)
Successful fluoro guided LP at L3-4. Opening pressure not measured due to procedure difficulty. 9 mL clear CSF sent for laboratory evaluation. No immediate complications.

## 2017-03-03 DIAGNOSIS — G030 Nonpyogenic meningitis: Secondary | ICD-10-CM | POA: Diagnosis not present

## 2017-03-03 LAB — URINE CULTURE: Culture: NO GROWTH

## 2017-03-03 LAB — CBC WITH DIFFERENTIAL/PLATELET
BASOS ABS: 0.1 10*3/uL (ref 0.0–0.1)
Basophils Relative: 1 %
EOS PCT: 1 %
Eosinophils Absolute: 0.1 10*3/uL (ref 0.0–0.7)
HEMATOCRIT: 39.5 % (ref 39.0–52.0)
Hemoglobin: 13 g/dL (ref 13.0–17.0)
LYMPHS ABS: 2.3 10*3/uL (ref 0.7–4.0)
Lymphocytes Relative: 38 %
MCH: 28.6 pg (ref 26.0–34.0)
MCHC: 32.9 g/dL (ref 30.0–36.0)
MCV: 87 fL (ref 78.0–100.0)
MONOS PCT: 8 %
Monocytes Absolute: 0.5 10*3/uL (ref 0.1–1.0)
NEUTROS PCT: 52 %
Neutro Abs: 3 10*3/uL (ref 1.7–7.7)
Platelets: 241 10*3/uL (ref 150–400)
RBC: 4.54 MIL/uL (ref 4.22–5.81)
RDW: 13.8 % (ref 11.5–15.5)
WBC: 6 10*3/uL (ref 4.0–10.5)

## 2017-03-03 LAB — BASIC METABOLIC PANEL
ANION GAP: 6 (ref 5–15)
BUN: 10 mg/dL (ref 6–20)
CALCIUM: 7.9 mg/dL — AB (ref 8.9–10.3)
CO2: 24 mmol/L (ref 22–32)
Chloride: 108 mmol/L (ref 101–111)
Creatinine, Ser: 0.93 mg/dL (ref 0.61–1.24)
GLUCOSE: 88 mg/dL (ref 65–99)
POTASSIUM: 4 mmol/L (ref 3.5–5.1)
Sodium: 138 mmol/L (ref 135–145)

## 2017-03-03 LAB — PROCALCITONIN

## 2017-03-03 LAB — RPR: RPR Ser Ql: NONREACTIVE

## 2017-03-03 MED ORDER — OXYCODONE-ACETAMINOPHEN 5-325 MG PO TABS: 1.0000 | ORAL_TABLET | ORAL | 0 refills | Status: DC | PRN

## 2017-03-03 NOTE — Progress Notes (Signed)
Received call from infection prevention. Stated to discontinue droplet precautions due to not being bacterial meningitis. Ordered d/c'd

## 2017-03-03 NOTE — Discharge Summary (Signed)
Physician Discharge Summary  Matthew Mejia WJX:[id number] DOB: 07-Mar-1991 DOA: 03/01/2017  PCP: Patient, No Pcp Per  Admit date: 03/01/2017 Discharge date: 03/03/2017  Time spent: 60 minutes  Recommendations for Outpatient Follow-up:  1. Patient has been given information to set up outpatient follow-up with PCP in 1-2 weeks.   Discharge Diagnoses:  Principal Problem:   Aseptic meningitis Active Problems:   Morbid obesity (HCC)   Headache   Fever   Neck pain   Discharge Condition: Stable and improved  Diet recommendation: Regular  Filed Weights   03/01/17 2027 03/02/17 0302  Weight: (!) 154.2 kg (340 lb) (!) 157.6 kg (347 lb 7.1 oz)    History of present illness:  Per Dr Lurline DelGardner Matthew Mejia is a 26 y.o. male with medical history significant of obesity.  Patient presented to the ED with c/o headache.  Headache onset ~2 weeks ago and has been persistent since onset.  Headache was dull and moderate.  There was radiation to neck.  Patient has mild neck stiffness but can touch chin to chest.  This has not changed since onset.  I asked at least 3 to 4 different ways, this has definitely been going on for ~2 weeks (not new in past 24-48 hours, no changes, etc). [facility], given single shot of a "steroid" and started on Augmentin (not quite sure what their thought process was). Symptoms persisted and he presented to ED.   ED Course: Tm 102.7 in ED, sleepy but AAOx3.  EDP gave 5L NS bolus initially thinking patient was septic, was going to give ABx but held off. LP attempted but not able to be performed successfully.  NL WBC, initially HR 105, improved to 80s-90s post IVF.  ABx, not initiated in ED.  Influenza pnl negative.    Hospital Course:  #1 aseptic meningitis Patient had presented with headache which was dull and moderate in nature with some radiation to the neck with some mild neck stiffness had been on Augmentin with no significant  improvement.  Patient was noted on admission to have a temperature of 102.7.  Lumbar puncture was attempted in the emergency room however unsuccessful.  Patient was admitted and lumbar puncture under fluoroscopy was ordered and done per interventional radiology on 03/02/2017 which was successful.  CSF cultures which were done with no organisms seen.  CSF culture for fungus was pending with no growth to date.  Blood cultures obtained were negative to date as well as urine cultures.  Puncture which was done was clear had a CSF glucose of 55, segmented neutrophils of 65, lymphs of 20, CSF WBC was 70.  It was felt patient likely had an aseptic meningitis.  RPR was ordered which was negative.  CSF HSV PCR was ordered and pending at time of discharge.  HIV PCR was negative.  Patient was hydrated with IV fluids, received IV analgesics and improved during the hospitalization.  Patient will be discharged in stable and improved condition is to follow-up with PCP in the outpatient setting.  #2 morbid obesity  Rest of patient's chronic medical issues remained stable throughout the hospitalization.  Procedures:  Lumbar puncture under fluoroscopy per interventional radiology Dr. Rito EhrlichKrishnan 03/02/2017  Chest x-ray 03/01/2017  CT head without contrast 03/01/2017    Consultations:  Curb sided ID: Dr. Daiva EvesVan Dam  Discharge Exam: Vitals:   03/03/17 0553 03/03/17 1416  BP: (!) 144/76 (!) 146/89  Pulse: 73 72  Resp: 18 18  Temp: 98.3  F (36.8 C) 99.2 F (37.3 C)  SpO2: 93% 100%    General: NAD Cardiovascular: RRR Respiratory: CTAB  Discharge Instructions   Discharge Instructions    Diet general   Complete by:  As directed    Increase activity slowly   Complete by:  As directed      Current Discharge Medication List    START taking these medications   Details  oxyCODONE-acetaminophen (PERCOCET/ROXICET) 5-325 MG tablet Take 1-2 tablets by mouth every 4 (four) hours as needed for moderate pain  or severe pain. Qty: 20 tablet, Refills: 0      STOP taking these medications     amoxicillin-clavulanate (AUGMENTIN) 875-125 MG per tablet        No Known Allergies Follow-up Information    [facility] Follow up.   Contact information: [address] [place] [phone]       [facility] Follow up.   Contact information: [place] [place] [phone]       [facility] Follow up.   Contact information: [address] [place] [id number] [phone]           The results of significant diagnostics from this hospitalization (including imaging, microbiology, ancillary and laboratory) are listed below for reference.    Significant Diagnostic Studies: Dg Chest 2 View  Result Date: 03/01/2017 CLINICAL DATA:  Fevers EXAM: CHEST  2 VIEW COMPARISON:  07/04/2012 FINDINGS: The overall inspiratory effort is poor with crowding of the vascular markings. No acute infiltrate or sizable effusion is seen. Cardiac shadow is accentuated by the frontal technique. No bony abnormality is noted. IMPRESSION: Poor inspiratory effort without acute abnormality. Electronically Signed   By: Alcide CleverMark  Lukens M.D.   On: 03/01/2017 21:13   Ct Head Wo Contrast  Result Date: 03/01/2017 CLINICAL DATA:  Initial evaluation for acute altered mental status, headache. EXAM: CT HEAD WITHOUT CONTRAST TECHNIQUE: Contiguous axial images were obtained from the base of the skull through the vertex without intravenous contrast. COMPARISON:  Prior CT from 11/01/2005. FINDINGS: Brain: Cerebral volume within normal limits for patient age. No evidence for acute intracranial hemorrhage. No findings to suggest acute large vessel territory infarct. No mass lesion, midline shift, or mass effect. Ventricles are normal in size without evidence for  hydrocephalus. No extra-axial fluid collection identified. Vascular: No hyperdense vessel identified. Skull: Scalp soft tissues demonstrate no acute abnormality.Calvarium intact. Sinuses/Orbits: Globes and orbital soft tissues are within normal limits. Visualized paranasal sinuses are clear. No mastoid effusion. IMPRESSION: Normal head CT.  No acute intracranial abnormality. Electronically Signed   By: Rise MuBenjamin  McClintock M.D.   On: 03/01/2017 22:47   Dg Fluoro Guide Lumbar Puncture  Result Date: 03/02/2017 CLINICAL DATA:  Intractable headache EXAM: DIAGNOSTIC LUMBAR PUNCTURE UNDER FLUOROSCOPIC GUIDANCE FLUOROSCOPY TIME:  Fluoroscopy Time:  24 seconds Radiation Exposure Index (if provided by the fluoroscopic device): 5.5 mGy Number of Acquired Spot Images: 1 PROCEDURE: Informed consent was obtained from the patient prior to the procedure, including potential complications of headache, allergy, and pain. With the patient prone, the lower back was prepped with Betadine. 1% Lidocaine was used for local anesthesia. Lumbar puncture was performed at the L3-4 level using a 22 gauge needle with return of clear CSF. 9 ml of CSF were obtained for laboratory studies. The patient tolerated the procedure well and there were no apparent complications. IMPRESSION: Successful fluoroscopic guided  lumbar puncture, as described above. Electronically Signed   By: Charline Bills M.D.   On: 03/02/2017 11:19    Microbiology: Recent Results (from the past 240 hour(s))  Blood Culture (routine x 2)     Status: None (Preliminary result)   Collection Time: 03/01/17  9:55 PM  Result Value Ref Range Status   Specimen Description BLOOD LEFT ANTECUBITAL  Final   Special Requests   Final    BOTTLES DRAWN AEROBIC AND ANAEROBIC Blood Culture adequate volume   Culture   Final    NO GROWTH 1 DAY Performed at [facility], [address] [place], [place] [address]    Report Status PENDING  Incomplete  Blood Culture  (routine x 2)     Status: None (Preliminary result)   Collection Time: 03/01/17 10:03 PM  Result Value Ref Range Status   Specimen Description BLOOD LEFT FOREARM  Final   Special Requests   Final    BOTTLES DRAWN AEROBIC AND ANAEROBIC Blood Culture adequate volume   Culture   Final    NO GROWTH 1 DAY Performed at [facility], [address], [place], [place] [address]    Report Status PENDING  Incomplete  Urine culture     Status: None   Collection Time: 03/01/17 10:03 PM  Result Value Ref Range Status   Specimen Description URINE, RANDOM  Final   Special Requests NONE  Final   Culture   Final    NO GROWTH Performed at [facility], [address], [place], [place] [address]    Report Status 03/03/2017 FINAL  Final  CSF culture     Status: None (Preliminary result)   Collection Time: 03/02/17 11:04 AM  Result Value Ref Range Status   Specimen Description CSF  Final   Special Requests NONE  Final   Gram Stain   Final    WBC PRESENT,BOTH PMN AND MONONUCLEAR NO ORGANISMS SEEN CYTOSPIN Gram Stain Report Called to,Read Back By and Verified With: SCOTT, D. RN @1218  ON 11.19.18 BY NMCCOY    Culture   Final    NO GROWTH < 24 HOURS Performed at [facility], [address], [place], [place] [address]    Report Status PENDING  Incomplete  Culture, fungus without smear     Status: None (Preliminary result)   Collection Time: 03/02/17 11:04 AM  Result Value Ref Range Status   Specimen Description CSF  Final   Special Requests NONE  Final   Culture   Final    NO FUNGUS ISOLATED AFTER 1 DAY Performed at [facility], [address], [place], [place] [address]    Report Status PENDING  Incomplete     Labs: Basic Metabolic Panel: Recent Labs  Lab 03/01/17 2051 03/02/17 0358 03/03/17 0619  NA 138 139 138  K 3.5 3.8 4.0  CL 102 109 108  CO2 26 23 24   GLUCOSE 95 96 88  BUN 15 13 10   CREATININE 1.40* 1.25* 0.93  CALCIUM 8.5* 7.6* 7.9*   Liver  Function Tests: Recent Labs  Lab 03/01/17 2051  AST 32  ALT 35  ALKPHOS 51  BILITOT 0.6  PROT 7.3  ALBUMIN 3.9   No results for input(s): LIPASE, AMYLASE in the last 168 hours. No results for input(s): AMMONIA in the last 168 hours. CBC: Recent Labs  Lab 03/01/17 2051 03/02/17 0358 03/03/17 0619  WBC 9.3 8.4 6.0  NEUTROABS 7.0  --  3.0  HGB 14.4 13.5 13.0  HCT 42.4 40.5 39.5  MCV 88.0 88.2 87.0  PLT 259 245 241   Cardiac Enzymes: No results for input(s): CKTOTAL, CKMB, CKMBINDEX, TROPONINI in the last 168 hours. BNP: BNP (last 3 results) No results for input(s): BNP in the last 8760 hours.  ProBNP (last 3 results) No results for input(s): PROBNP in the last 8760 hours.  CBG: No results for input(s): GLUCAP in the last 168 hours.     Signed:  Ramiro Harvest MD.  [facility] 03/03/2017, 4:36 PM

## 2017-03-04 LAB — HERPES SIMPLEX VIRUS(HSV) DNA BY PCR
HSV 1 DNA: NEGATIVE
HSV 2 DNA: NEGATIVE

## 2017-03-05 LAB — CSF CULTURE

## 2017-03-05 LAB — CSF CULTURE W GRAM STAIN: Culture: NO GROWTH

## 2017-03-07 LAB — CULTURE, BLOOD (ROUTINE X 2)
Culture: NO GROWTH
Culture: NO GROWTH
SPECIAL REQUESTS: ADEQUATE
Special Requests: ADEQUATE

## 2017-03-23 LAB — CULTURE, FUNGUS WITHOUT SMEAR

## 2017-04-01 ENCOUNTER — Emergency Department (HOSPITAL_COMMUNITY)
Admit: 2017-04-01 | Discharge: 2017-04-02 | Disposition: A | Discharge status: Home / Self Care | Payer: BLUE CROSS/BLUE SHIELD | Attending: Emergency Medicine | Admitting: Emergency Medicine

## 2017-04-01 ENCOUNTER — Other Ambulatory Visit: Payer: Self-pay

## 2017-04-01 DIAGNOSIS — Z5321 Procedure and treatment not carried out due to patient leaving prior to being seen by health care provider: Secondary | ICD-10-CM | POA: Diagnosis not present

## 2017-04-01 DIAGNOSIS — R51 Headache: Secondary | ICD-10-CM | POA: Insufficient documentation

## 2017-04-01 DIAGNOSIS — M79605 Pain in left leg: Secondary | ICD-10-CM | POA: Diagnosis present
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
## 2017-04-14 ENCOUNTER — Ambulatory Visit (HOSPITAL_COMMUNITY)
Admission: EM | Admit: 2017-04-14 | Discharge: 2017-04-14 | Disposition: A | Discharge status: Home / Self Care | Payer: BLUE CROSS/BLUE SHIELD | Attending: Family Medicine | Admitting: Family Medicine

## 2017-04-14 ENCOUNTER — Encounter (HOSPITAL_COMMUNITY): Payer: Self-pay | Admitting: Emergency Medicine

## 2017-04-14 ENCOUNTER — Other Ambulatory Visit: Payer: Self-pay

## 2017-04-14 DIAGNOSIS — S39012A Strain of muscle, fascia and tendon of lower back, initial encounter: Secondary | ICD-10-CM

## 2017-04-14 DIAGNOSIS — S161XXA Strain of muscle, fascia and tendon at neck level, initial encounter: Secondary | ICD-10-CM | POA: Diagnosis not present

## 2017-04-14 MED ORDER — DEXAMETHASONE SODIUM PHOSPHATE 10 MG/ML IJ SOLN
10.0000 mg | Freq: Once | INTRAMUSCULAR | Status: AC
  Administered 2017-04-14: 10 mg via INTRAMUSCULAR

## 2017-04-14 MED ORDER — KETOROLAC TROMETHAMINE 60 MG/2ML IM SOLN
60.0000 mg | Freq: Once | INTRAMUSCULAR | Status: AC
  Administered 2017-04-14: 60 mg via INTRAMUSCULAR

## 2017-04-14 MED ORDER — KETOROLAC TROMETHAMINE 60 MG/2ML IM SOLN
INTRAMUSCULAR | Status: AC
  Filled 2017-04-14: qty 2

## 2017-04-14 MED ORDER — DEXAMETHASONE SODIUM PHOSPHATE 10 MG/ML IJ SOLN
INTRAMUSCULAR | Status: AC
  Filled 2017-04-14: qty 1

## 2017-04-14 MED ORDER — CYCLOBENZAPRINE HCL 10 MG PO TABS: 10.0000 mg | ORAL_TABLET | Freq: Every day | ORAL | 0 refills | Status: DC
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
## 2017-06-27 ENCOUNTER — Other Ambulatory Visit: Payer: Self-pay

## 2017-06-27 ENCOUNTER — Ambulatory Visit (HOSPITAL_COMMUNITY)
Admission: EM | Admit: 2017-06-27 | Discharge: 2017-06-27 | Disposition: A | Discharge status: Home / Self Care | Payer: BLUE CROSS/BLUE SHIELD | Attending: Family Medicine | Admitting: Family Medicine

## 2017-06-27 ENCOUNTER — Encounter (HOSPITAL_COMMUNITY): Payer: Self-pay | Admitting: Emergency Medicine

## 2017-06-27 DIAGNOSIS — G8929 Other chronic pain: Secondary | ICD-10-CM | POA: Diagnosis not present

## 2017-06-27 DIAGNOSIS — M25562 Pain in left knee: Secondary | ICD-10-CM

## 2017-06-27 MED ORDER — PREDNISONE 10 MG (21) PO TBPK: ORAL_TABLET | Freq: Every day | ORAL | 0 refills | Status: DC

## 2017-06-27 MED ORDER — DICLOFENAC SODIUM 75 MG PO TBEC: 75.0000 mg | DELAYED_RELEASE_TABLET | Freq: Two times a day (BID) | ORAL | 0 refills | Status: DC

## 2017-06-27 NOTE — ED Triage Notes (Signed)
Patient says he needs a knee replacement but for the past 2 days left knee has been painful, making it difficult to walk

## 2017-06-27 NOTE — ED Provider Notes (Signed)
[facility]   [id number] 06/27/17 Arrival Time: 1202  ASSESSMENT & PLAN:  1. Chronic pain of left knee    Meds ordered this encounter  Medications  . diclofenac (VOLTAREN) 75 MG EC tablet    Sig: Take 1 tablet (75 mg total) by mouth 2 (two) times daily.    Dispense:  14 tablet    Refill:  0  . predniSONE (STERAPRED UNI-PAK 21 TAB) 10 MG (21) TBPK tablet    Sig: Take by mouth daily. Take as directed.    Dispense:  21 tablet    Refill:  0   Plans to call his orthopaedist Monday morning to arrange f/u. May f/u here as needed. Work note given. Reviewed expectations re: course of current medical issues. Questions answered. Outlined signs and symptoms indicating need for more acute intervention. Patient verbalized understanding. After Visit Summary given.  SUBJECTIVE: History from: patient. Matthew Mejia is a 27 y.o. male who reports intermittent localized moderate pain of his left lateral knee that is chronic; described as aching without radiation. H/O knee surgery. Records from April of 2018 reviewed in his chart. Pain worse over the past few weeks. Pain with ambulation. A little better with rest. "Told I'll need a knee replacement at some point." No new injury/trauma. Worsened by: certain movements. Associated symptoms: none reported. Extremity sensation changes or weakness: none. Occasional ibuprofen with mild help.  ROS: As per HPI.   OBJECTIVE:  Vitals:   06/27/17 1220  BP: 128/81  Pulse: 80  Resp: 20  Temp: 98 F (36.7 C)  TempSrc: Oral  SpO2: 96%    General appearance: alert; no distress Extremities: no cyanosis or edema; symmetrical with no gross deformities; poorly localized tenderness over his left lateral knee with mild swelling and no bruising; ROM: normal with discomfort CV: normal extremity capillary refill Skin: warm and dry Neurologic: normal gait but favors LLE; normal symmetric reflexes in all extremities; normal sensation in all  extremities Psychological: alert and cooperative; normal mood and affect  No Known Allergies  Past Medical History:  Diagnosis Date  . Obesity    Social History   Socioeconomic History  . Marital status: Single    Spouse name: Not on file  . Number of children: Not on file  . Years of education: Not on file  . Highest education level: Not on file  Social Needs  . Financial resource strain: Not on file  . Food insecurity - worry: Not on file  . Food insecurity - inability: Not on file  . Transportation needs - medical: Not on file  . Transportation needs - non-medical: Not on file  Occupational History  . Not on file  Tobacco Use  . Smoking status: Current Some Day Smoker    Packs/day: 0.00    Years: 0.00    Pack years: 0.00    Types: Cigars  . Smokeless tobacco: Never Used  Substance and Sexual Activity  . Alcohol use: Yes    Alcohol/week: 1.2 oz    Types: 2 Cans of beer per week  . Drug use: No  . Sexual activity: Yes    Birth control/protection: Condom  Other Topics Concern  . Not on file  Social History Narrative  . Not on file   Family History  Problem Relation Age of Onset  . Diabetes Mother   . Heart disease Mother   . Kidney disease Mother   . Hypertension Mother   . Hyperlipidemia Mother   . Hypertension Father  Past Surgical History:  Procedure Laterality Date  . arm surgery  2013  . ELBOW SURGERY    . KNEE SURGERY  2006  . left knee sx 4-5 years ago        Mardella LaymanHagler, Manaia Samad, MD 06/27/17 1334

## 2017-10-28 ENCOUNTER — Ambulatory Visit (HOSPITAL_COMMUNITY)
Admission: EM | Admit: 2017-10-28 | Discharge: 2017-10-28 | Disposition: A | Discharge status: Home / Self Care | Payer: BLUE CROSS/BLUE SHIELD | Attending: Internal Medicine | Admitting: Internal Medicine

## 2017-10-28 ENCOUNTER — Ambulatory Visit (INDEPENDENT_AMBULATORY_CARE_PROVIDER_SITE_OTHER): Payer: BLUE CROSS/BLUE SHIELD

## 2017-10-28 ENCOUNTER — Encounter (HOSPITAL_COMMUNITY): Payer: Self-pay | Admitting: Emergency Medicine

## 2017-10-28 DIAGNOSIS — M79671 Pain in right foot: Secondary | ICD-10-CM

## 2017-10-28 MED ORDER — NAPROXEN 500 MG PO TABS: 500.0000 mg | ORAL_TABLET | Freq: Two times a day (BID) | ORAL | 0 refills | Status: AC

## 2017-10-28 NOTE — Discharge Instructions (Signed)
Use anti-inflammatories for pain/swelling. You may take up to 800 mg Ibuprofen every 8 hours with food. You may supplement Ibuprofen with Tylenol [phone] mg every 8 hours OR Naprosyn twice daily  Ice, elevate foot  Gentle stretching

## 2017-10-28 NOTE — ED Provider Notes (Signed)
[facility]    CSN: [id number] Arrival date & time: 10/28/17  1128     History   Chief Complaint Chief Complaint  Patient presents with  . Foot Pain    HPI Matthew Mejia is a 27 y.o. male no contributing past medical history presenting today for evaluation of right foot pain.  Patient states that his symptoms began last night, but worsened this morning when he woke up.  He has had the pain to the outer aspect of his right foot.  States it is on the bottom.  Denies any specific injury.  He is on his feet for 8 hours while at work, but denies any recent increase.  States he wears Shon Baton that he has had for approximately 3 weeks.  Denies any numbness or tingling.  Pain does not radiate up into the ankle or leg.  HPI  Past Medical History:  Diagnosis Date  . Obesity     Patient Active Problem List   Diagnosis Date Noted  . Headache 03/02/2017  . Fever 03/02/2017  . Aseptic meningitis 03/02/2017  . Neck pain   . Lumbar paraspinal muscle spasm 07/19/2012  . Bilateral hand pain 07/19/2012  . Morbid obesity (HCC) 07/19/2012  . Dislocation of elbow, lateral, right, closed 07/20/2011    Past Surgical History:  Procedure Laterality Date  . arm surgery  2013  . ELBOW SURGERY    . KNEE SURGERY  2006  . left knee sx 4-5 years ago         Home Medications    Prior to Admission medications   Medication Sig Start Date End Date Taking? Authorizing Provider  naproxen (NAPROSYN) 500 MG tablet Take 1 tablet (500 mg total) by mouth 2 (two) times daily. 10/28/17   Axle Parfait, Junius Creamer, PA-C    Family History Family History  Problem Relation Age of Onset  . Diabetes Mother   . Heart disease Mother   . Kidney disease Mother   . Hypertension Mother   . Hyperlipidemia Mother   . Hypertension Father     Social History Social History   Tobacco Use  . Smoking status: Current Some Day Smoker    Packs/day: 0.00    Years: 0.00    Pack years: 0.00    Types: Cigars  .  Smokeless tobacco: Never Used  Substance Use Topics  . Alcohol use: Yes    Alcohol/week: 1.2 oz    Types: 2 Cans of beer per week  . Drug use: No     Allergies   Patient has no known allergies.   Review of Systems Review of Systems  Constitutional: Negative for activity change, chills, diaphoresis and fatigue.  Eyes: Negative for photophobia and visual disturbance.  Respiratory: Negative for cough, chest tightness and shortness of breath.   Cardiovascular: Negative for chest pain and leg swelling.  Gastrointestinal: Negative for abdominal pain, blood in stool, nausea and vomiting.  Musculoskeletal: Positive for gait problem and myalgias. Negative for arthralgias, back pain, neck pain and neck stiffness.  Skin: Negative for color change and wound.  Neurological: Negative for dizziness, weakness, light-headedness, numbness and headaches.     Physical Exam Triage Vital Signs ED Triage Vitals [10/28/17 1152]  Enc Vitals Group     BP 137/85     Pulse Rate 90     Resp 18     Temp 98.7 F (37.1 C)     Temp Source Oral     SpO2 99 %  Weight      Height      Head Circumference      Peak Flow      Pain Score      Pain Loc      Pain Edu?      Excl. in GC?    No data found.  Updated Vital Signs BP 137/85 (BP Location: Left Arm)   Pulse 90   Temp 98.7 F (37.1 C) (Oral)   Resp 18   SpO2 99%   Visual Acuity Right Eye Distance:   Left Eye Distance:   Bilateral Distance:    Right Eye Near:   Left Eye Near:    Bilateral Near:     Physical Exam  Constitutional: He is oriented to person, place, and time. He appears well-developed and well-nourished.  No acute distress  HENT:  Head: Normocephalic and atraumatic.  Nose: Nose normal.  Eyes: Conjunctivae are normal.  Neck: Neck supple.  Cardiovascular: Normal rate.  Pulmonary/Chest: Effort normal. No respiratory distress.  Abdominal: He exhibits no distension.  Musculoskeletal: Normal range of motion.  Full  active range of motion at ankle and toes, tenderness to palpation over lateral aspect of foot on plantar surface, especially near base of pinky toe, nontender to palpation of dorsal surface.  No calf tenderness.  Neurological: He is alert and oriented to person, place, and time.  Skin: Skin is warm and dry.  Tissue on dorsum of foot feels slightly thickened/callus, no erythema or increased warmth  Psychiatric: He has a normal mood and affect.  Nursing note and vitals reviewed.    UC Treatments / Results  Labs (all labs ordered are listed, but only abnormal results are displayed) Labs Reviewed - No data to display  EKG None  Radiology No results found.  Procedures Procedures (including critical care time)  Medications Ordered in UC Medications - No data to display  Initial Impression / Assessment and Plan / UC Course  I have reviewed the triage vital signs and the nursing notes.  Pertinent labs & imaging results that were available during my care of the patient were reviewed by me and considered in my medical decision making (see chart for details).     Patient with foot pain without injury.  Seems more inflammatory versus infectious or ischemic, also does not correlate well with gout at this time.  Will treat with anti-inflammatories, ice, gentle stretching as well as discussed using golf ball for possible plantar fasciitis although not located on heel.Discussed strict return precautions. Patient verbalized understanding and is agreeable with plan.  Final Clinical Impressions(s) / UC Diagnoses   Final diagnoses:  Foot pain, right     Discharge Instructions     Use anti-inflammatories for pain/swelling. You may take up to 800 mg Ibuprofen every 8 hours with food. You may supplement Ibuprofen with Tylenol [phone] mg every 8 hours OR Naprosyn twice daily  Ice, elevate foot  Gentle stretching     ED Prescriptions    Medication Sig Dispense Auth. Provider   naproxen  (NAPROSYN) 500 MG tablet Take 1 tablet (500 mg total) by mouth 2 (two) times daily. 30 tablet Dreyson Mishkin, ChelyanHallie C, PA-C     Controlled Substance Prescriptions [place] Controlled Substance Registry consulted? Not Applicable   Lew DawesWieters, Adelina Collard C, [place]PA-C 10/28/17 1239

## 2017-10-28 NOTE — ED Triage Notes (Signed)
Pt sts right foot pain near pinky toe; pt denies injury

## 2018-10-25 ENCOUNTER — Ambulatory Visit
Admission: RE | Admit: 2018-10-25 | Discharge: 2018-10-25 | Disposition: A | Discharge status: Home / Self Care | Payer: Disability Insurance | Source: Ambulatory Visit | Attending: Thoracic Surgery | Admitting: Thoracic Surgery

## 2018-10-25 ENCOUNTER — Other Ambulatory Visit: Payer: Self-pay | Admitting: Thoracic Surgery

## 2018-10-25 DIAGNOSIS — M899 Disorder of bone, unspecified: Secondary | ICD-10-CM | POA: Insufficient documentation

## 2019-03-10 IMAGING — CT CT HEAD W/O CM
3 of 4 series · 14 of 47 positions shown, 16 images · non-contrast
Comparison: Prior CT from 11/01/2005.

CLINICAL DATA: Initial evaluation for acute altered mental status,
headache.

EXAM:
CT HEAD WITHOUT CONTRAST
TECHNIQUE: Contiguous axial images were obtained from the base of the skull
through the vertex without intravenous contrast.

[Series 2: head w/o · axial · non-contrast · 0.51mm/px · z∈[+1530,+1660]mm · 8 of 32 slices shown, 10 images]
[im 3/32  brain]
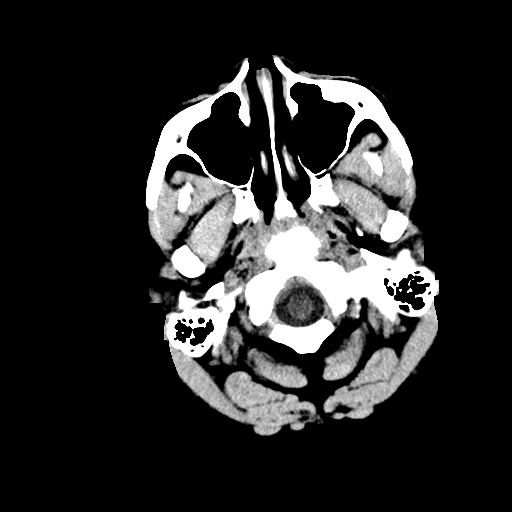
[im 3/32  bone]
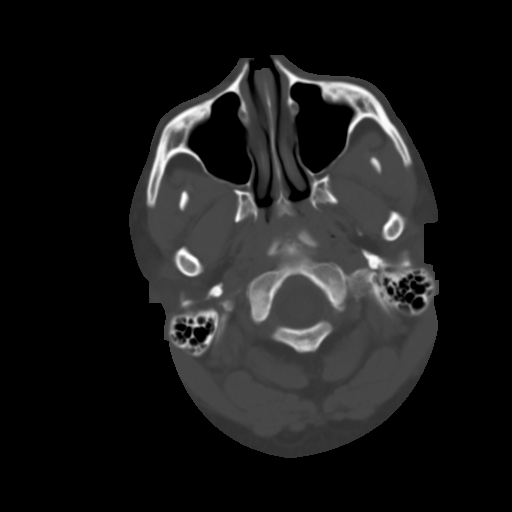
[im 7/32  brain]
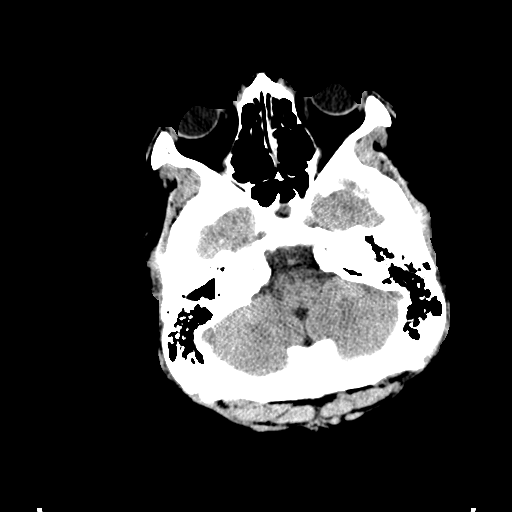
[im 12/32  brain]
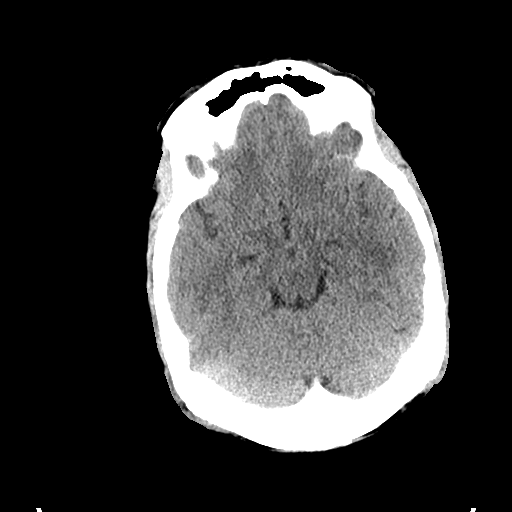
[im 14/32  brain]
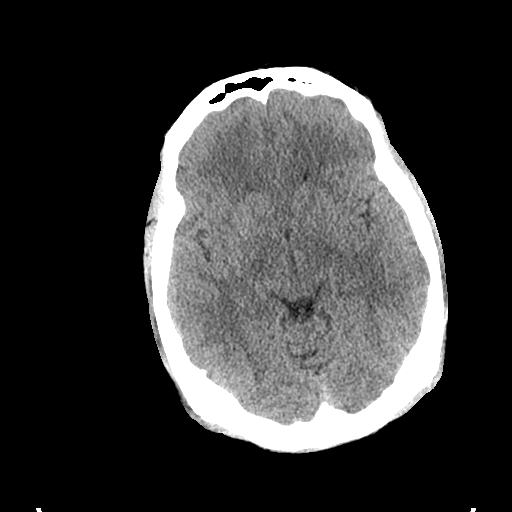
[im 18/32  brain]
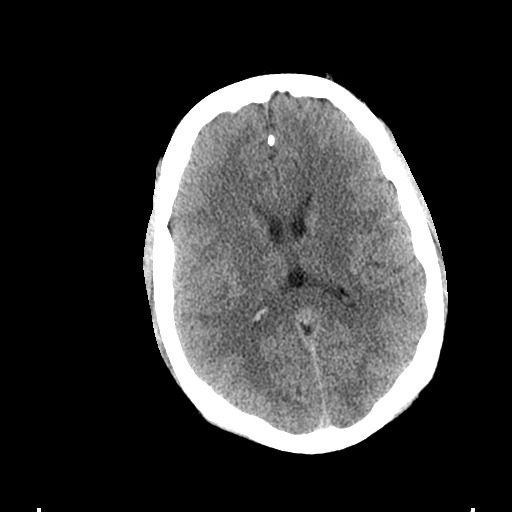
[im 18/32  bone]
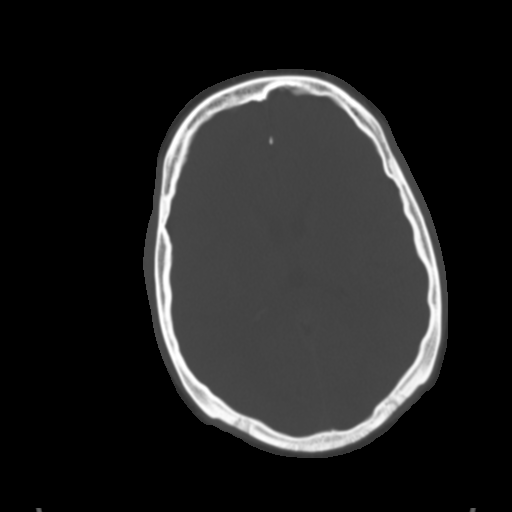
[im 20/32  brain]
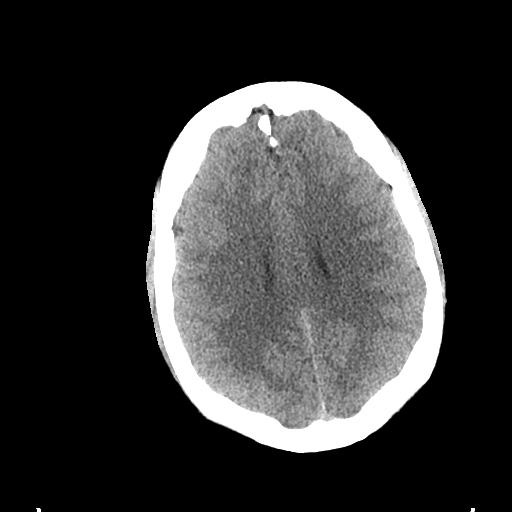
[im 25/32  brain]
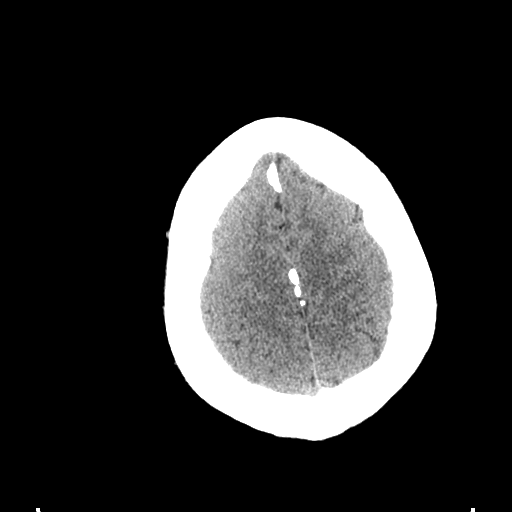
[im 29/32  brain]
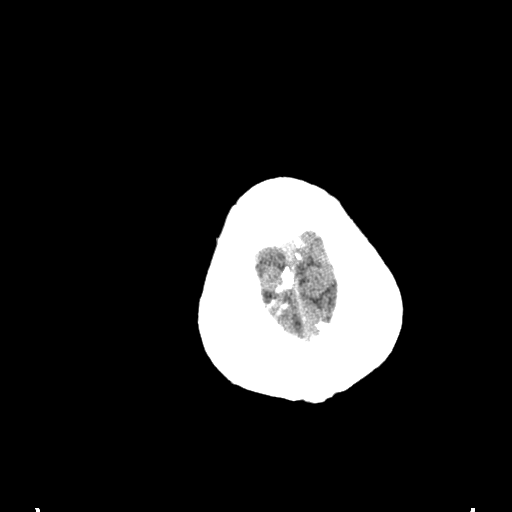

[Series 4: coronal · coronal · 0.33mm/px · 3 of 83 slices shown]
[im 28/83  brain]
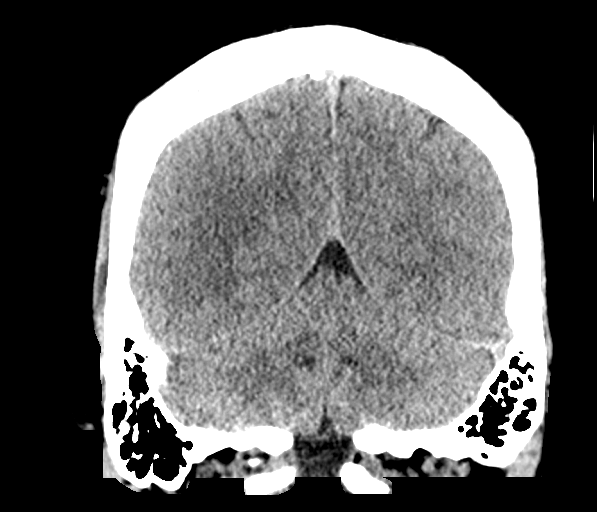
[im 37/83  brain]
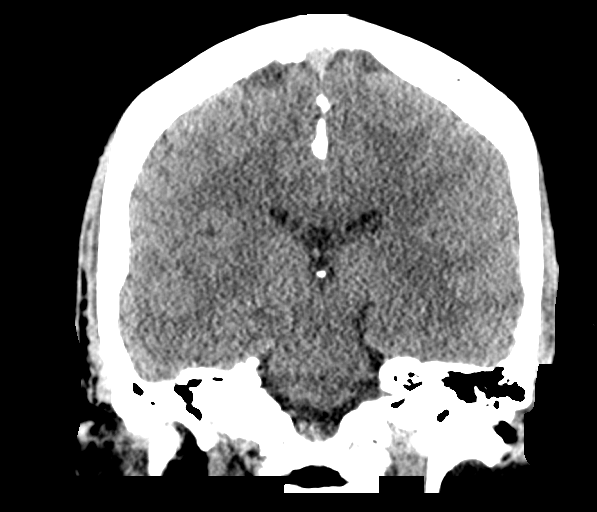
[im 46/83  brain]
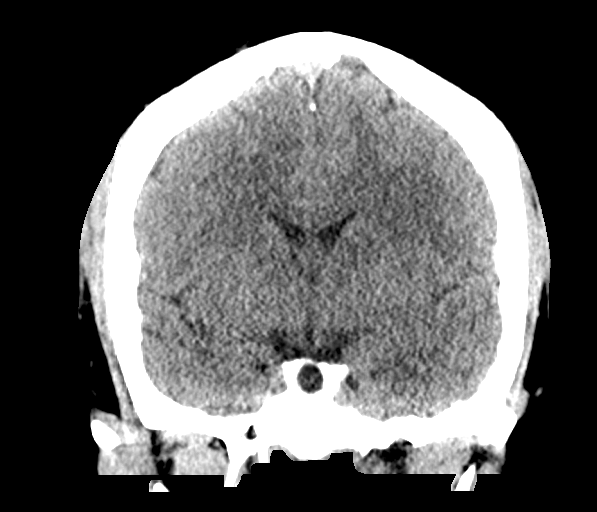

[Series 5: sagittal · sagittal · 0.31mm/px · 3 of 77 slices shown]
[im 26/77  brain]
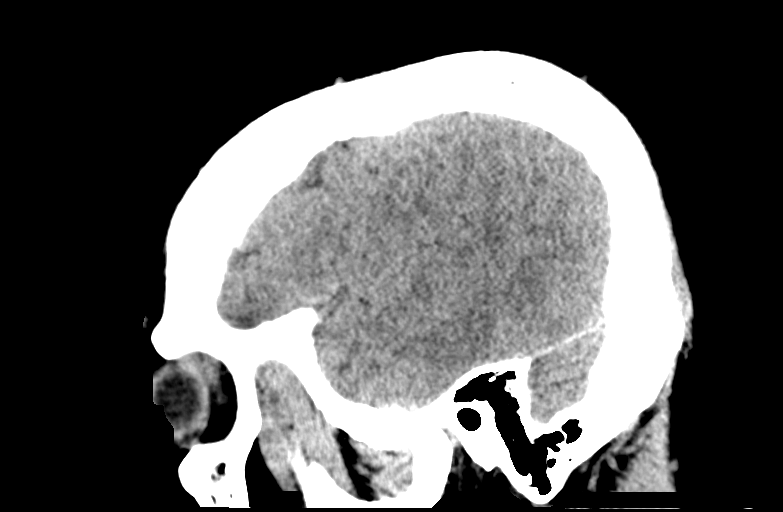
[im 39/77  brain]
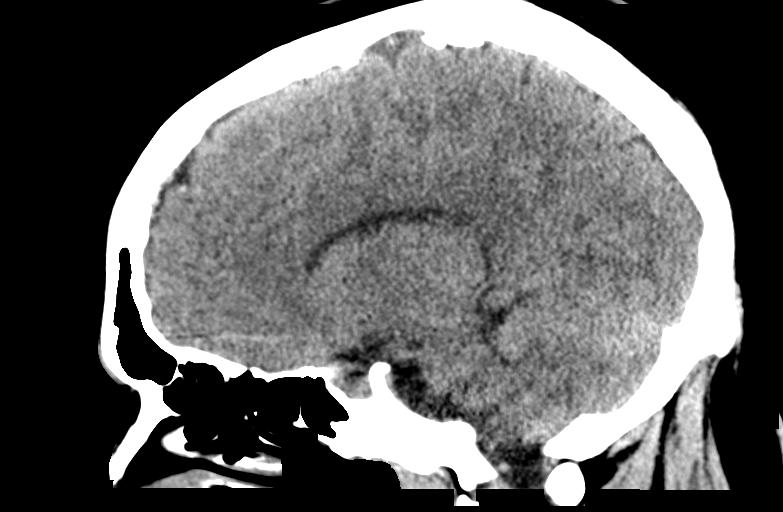
[im 51/77  brain]
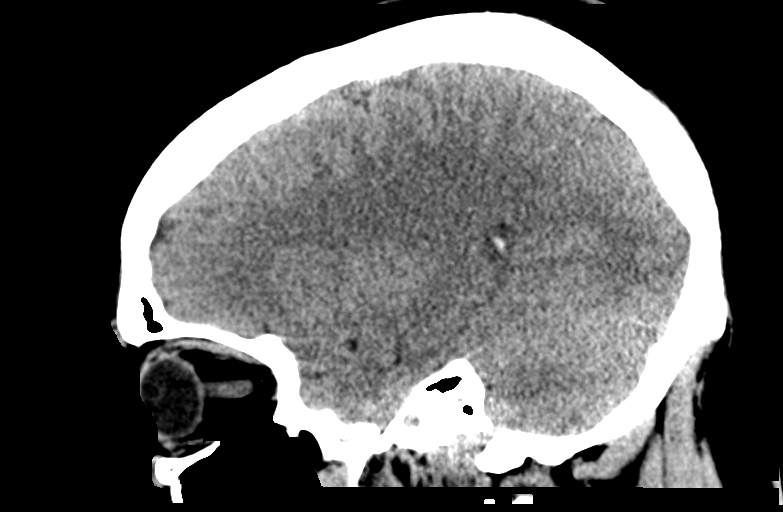

[14 of 47 positions shown; findings below may reference images not displayed]

FINDINGS: Brain: Cerebral volume within normal limits for patient age.

No evidence for acute intracranial hemorrhage. No findings to
suggest acute large vessel territory infarct. No mass lesion,
midline shift, or mass effect. Ventricles are normal in size without
evidence for hydrocephalus. No extra-axial fluid collection
identified.

Vascular: No hyperdense vessel identified.

Skull: Scalp soft tissues demonstrate no acute abnormality.Calvarium
intact.

Sinuses/Orbits: Globes and orbital soft tissues are within normal
limits.

Visualized paranasal sinuses are clear. No mastoid effusion.
IMPRESSION: Normal head CT.  No acute intracranial abnormality.

## 2020-11-02 IMAGING — CR RIGHT ELBOW - 2 VIEW
1 series · 2 of 2 positions shown · non-contrast
Comparison: None.

CLINICAL DATA: Chronic right elbow pain of dislocation 5 years ago.

EXAM:
RIGHT ELBOW - 2 VIEW

[Series 1: dg elbow 2 views right · 0.14mm/px · 2 of 2 slices shown]
[im 1/2]
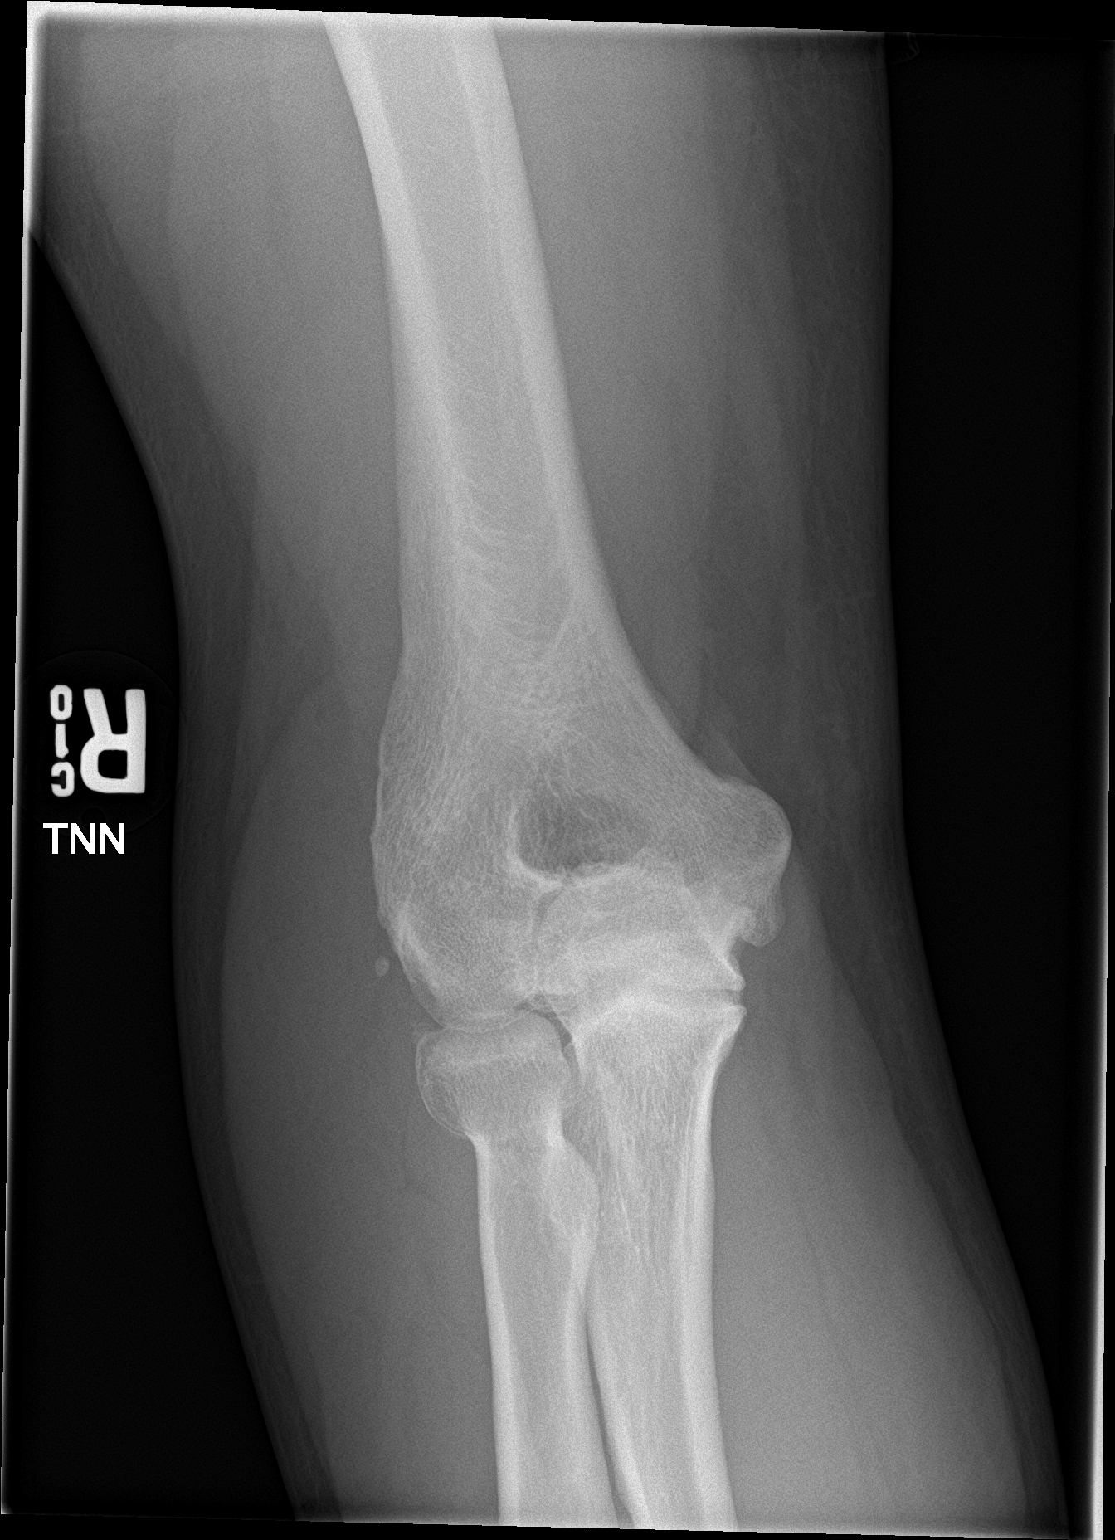
[im 2/2]
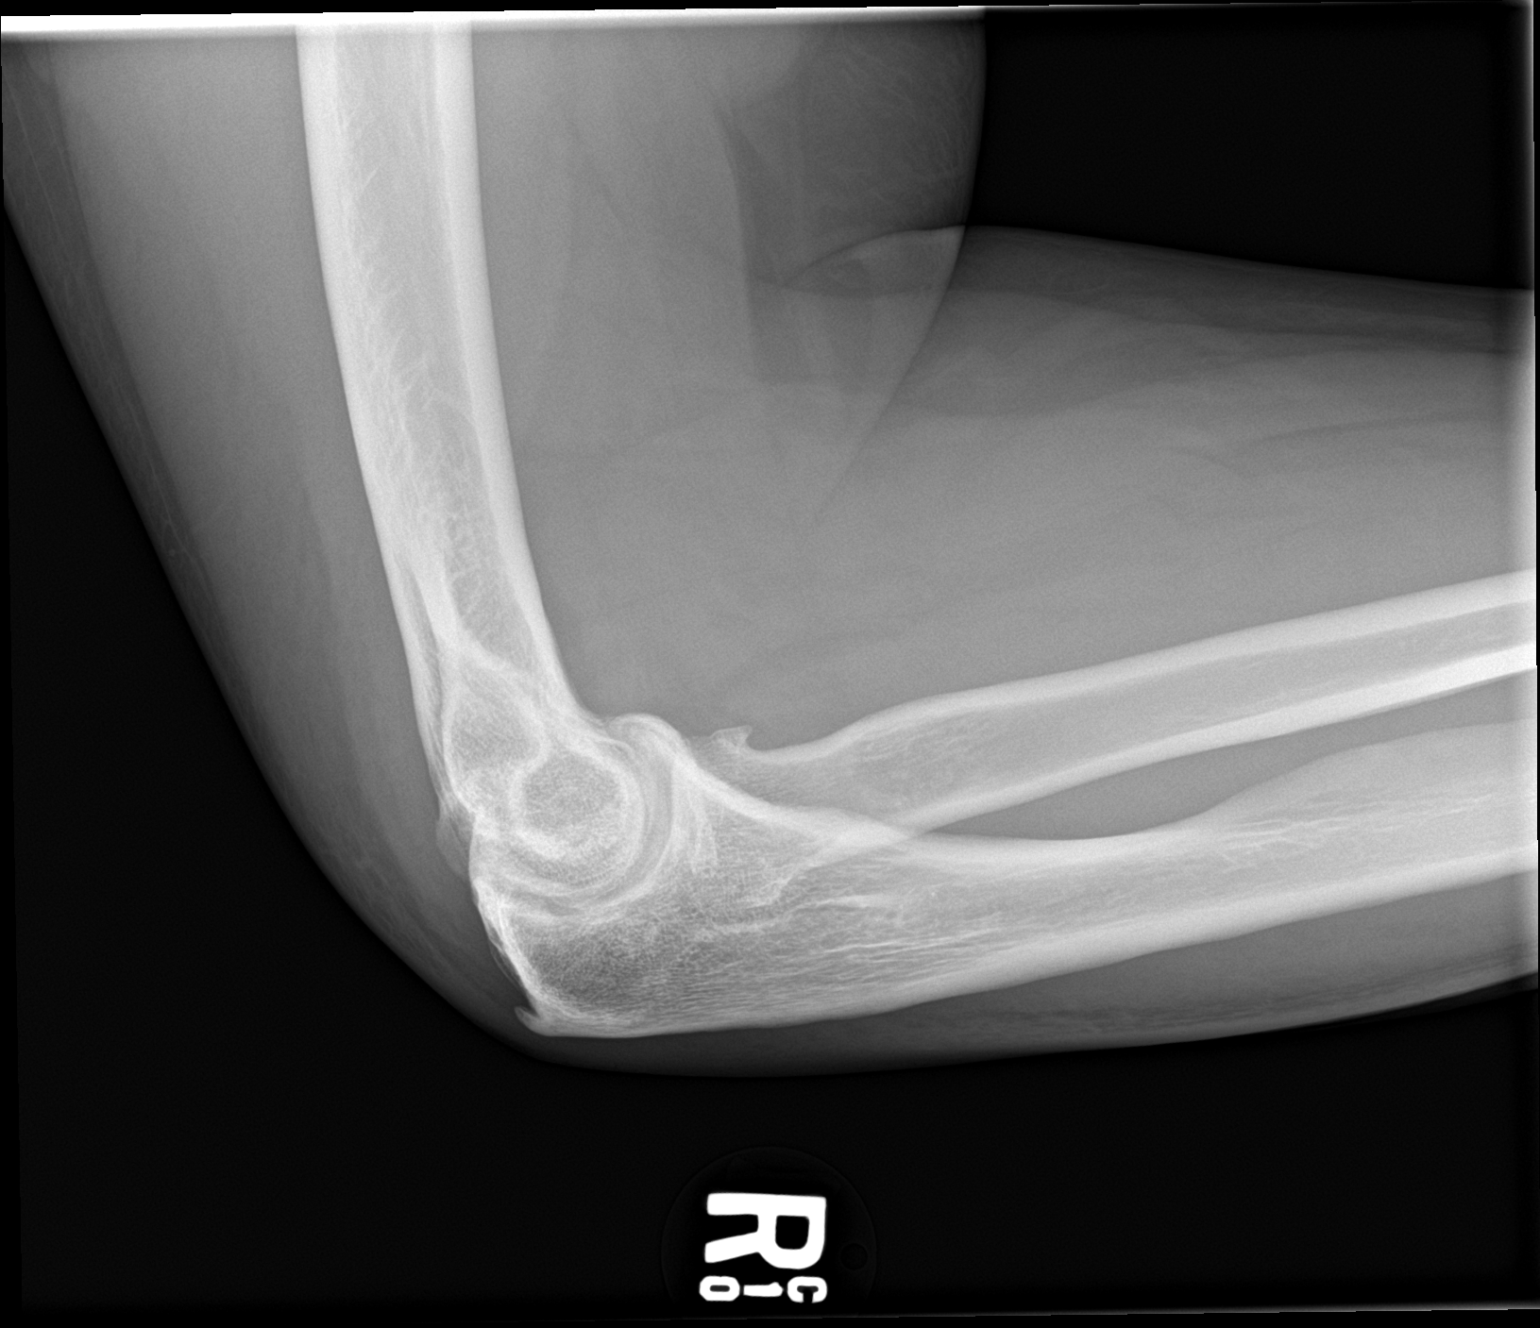

[2 of 2 positions shown; findings below may reference images not displayed]

FINDINGS: There is no evidence of fracture, dislocation, or joint effusion. No
significant arthropathy. Mild enthesophytes off of the olecranon and
radial head. Soft tissues are unremarkable.
IMPRESSION: No acute fracture or dislocation identified about the right elbow.

Mild enthesophytes off the olecranon and radial head.

## 2022-01-01 ENCOUNTER — Encounter: Payer: Self-pay | Admitting: Primary Care

## 2022-01-01 ENCOUNTER — Ambulatory Visit (INDEPENDENT_AMBULATORY_CARE_PROVIDER_SITE_OTHER): Payer: Medicaid Other | Admitting: Primary Care

## 2022-01-01 VITALS — BP 128/82 | HR 74 | Temp 98.7°F | Ht 73.0 in | Wt 336.4 lb

## 2022-01-01 DIAGNOSIS — R0683 Snoring: Secondary | ICD-10-CM

## 2022-01-01 NOTE — Assessment & Plan Note (Addendum)
-   Patient has symptoms of loud snoring, witnessed apnea and daytime sleepiness.  Epworth score is 21.  BMI 44.  Strong suspicion patient has underlying obstructive sleep apnea, needs home sleep study to evaluate.  We discussed risks of untreated sleep apnea including cardiac arrhythmias, pulmonary hypertension, stroke and diabetes.  We also reviewed treatment options including weight loss, oral appliance, CPAP therapy or referral to ENT for possible surgical options.  Encourage weight loss efforts.  Advised patient get wedge pillow to elevate head 30 degrees while sleeping.  Advised against driving experiencing excessive daytime sleepiness.  Follow-up 1 to 2 weeks after sleep study to review results and treatment options if needed.

## 2022-01-01 NOTE — Patient Instructions (Addendum)
  Sleep apnea is defined as period of 10 seconds or longer when you stop breathing at night. This can happen multiple times a night. Dx sleep apnea is when this occurs more than 5 times an hour.    Mild OSA 5-15 apneic events an hour Moderate OSA 15-30 apneic events an hour Severe OSA > 30 apneic events an hour   Untreated sleep apnea puts you at higher risk for cardiac arrhythmias, pulmonary HTN, stroke and diabetes  Treatment options include weight loss, side sleeping position, oral appliance, CPAP therapy or referral to ENT for possible surgical options    Recommendations: Focus on side sleeping position or elevate head with wedge pillow 30 degrees Work on weight loss efforts if able  Do not drive if experiencing excessive daytime sleepiness of fatigue    Orders: Home sleep study re: loud snoring (ordered)   Follow-up: Please call to schedule follow-up 1-2 weeks after completing home sleep study to review results and treatment if needed (can be virtual) 

## 2022-01-01 NOTE — Progress Notes (Signed)
@Matthew Mejia  ID: , male    DOB: 02/23/1991, 31 y.o.   MRN: [id number]  Chief Complaint  Matthew Mejia presents with   Consult    Referring provider: [id number], MD  HPI: 31 year old male, some day smoker.  Past medical history significant for meningitis, headaches, neck pain, morbid obesity.  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  Typical bedtime is 7 PM.  It takes him on average 10 to 20 minutes to fall asleep.  01/01/2022 Presents today for sleep consult.  Accompanied by his wife. Matthew Mejia has symptoms of loud snoring, witnessed apnea and daytime sleepiness. Over the last couple of months his wife has noticed that he stops breathing in his sleep, mainly when he sleeps on his back. No nocturnal awakenings.  Typical bedtime is 7 PM.  No issues falling asleep.  No nocturnal awakenings.  He starts his day at 2:30 AM.  He operates heavy machinery at work and 01/03/2022 trucks. His weight is up approximately 10 pounds.  He has never had a sleep study.  His father has sleep apnea and wears a CPAP.  Epworth score is 21. No symptoms of narcolepsy, cataplexy or sleepwalking.  Sleep questionnaire Symptoms-  Loud snoring, witnessed apnea, daytime sleepiness  Prior sleep study- None  Bedtime- 7pm Time to fall asleep- 10-34mins  Nocturnal awakenings- 0 Out of bed/start of day- 2:30am Weight changes- 10 lbs Do you operate heavy machinery- Yes Do you currently wear CPAP- No Do you current wear oxygen- No Epworth- 21   No Known Allergies  Immunization History  Administered Date(s) Administered   PFIZER(Purple Top)SARS-COV-2 Vaccination 11/02/2019, 11/23/2019    Past Medical History:  Diagnosis Date   Obesity     Tobacco History: Social History   Tobacco Use  Smoking Status Some Days   Packs/day: 0.50   Years: 0.00   Total pack years: 0.00   Types: Cigars, Cigarettes   Passive exposure: Past  Smokeless Tobacco Never  Tobacco  Comments   Pt states he smoke 1/2 pack cigs daily.    Ready to quit: Not Answered Counseling given: Not Answered Tobacco comments: Pt states he smoke 1/2 pack cigs daily.    Outpatient Medications Prior to Visit  Medication Sig Dispense Refill   naproxen (NAPROSYN) 500 MG tablet Take 1 tablet (500 mg total) by mouth 2 (two) times daily. 30 tablet 0   No facility-administered medications prior to visit.    Review of Systems  Review of Systems  Constitutional:  Positive for fatigue.  HENT: Negative.    Respiratory:  Positive for apnea.   Psychiatric/Behavioral:  Positive for sleep disturbance.     Physical Exam  BP 128/82 (BP Location: Right Arm, Matthew Mejia Position: Sitting, Cuff Size: Large)   Pulse 74   Temp 98.7 F (37.1 C) (Oral)   Ht 6\' 1"  (1.854 m)   Wt (!) 336 lb 6.4 oz (152.6 kg)   SpO2 98%   BMI 44.38 kg/m  Physical Exam Constitutional:      General: He is not in acute distress.    Appearance: Normal appearance. He is obese. He is not ill-appearing.  HENT:     Head: Normocephalic and atraumatic.     Mouth/Throat:     Mouth: Mucous membranes are moist.     Pharynx: Oropharynx is clear.  Cardiovascular:     Rate and Rhythm: Normal rate and regular rhythm.  Pulmonary:     Effort: Pulmonary effort is  normal.     Breath sounds: Normal breath sounds.  Musculoskeletal:        General: Normal range of motion.  Skin:    General: Skin is warm and dry.  Neurological:     General: No focal deficit present.     Mental Status: He is alert and oriented to person, place, and time. Mental status is at baseline.  Psychiatric:        Mood and Affect: Mood normal.        Behavior: Behavior normal.        Thought Content: Thought content normal.        Judgment: Judgment normal.      Lab Results:  CBC    Component Value Date/Time   WBC 6.0 03/03/2017 0619   RBC 4.54 03/03/2017 0619   HGB 13.0 03/03/2017 0619   HCT 39.5 03/03/2017 0619   PLT 241 03/03/2017 0619    MCV 87.0 03/03/2017 0619   MCV 89.9 07/04/2012 1440   MCH 28.6 03/03/2017 0619   MCHC 32.9 03/03/2017 0619   RDW 13.8 03/03/2017 0619   LYMPHSABS 2.3 03/03/2017 0619   MONOABS 0.5 03/03/2017 0619   EOSABS 0.1 03/03/2017 0619   BASOSABS 0.1 03/03/2017 0619    BMET    Component Value Date/Time   NA 138 03/03/2017 0619   K 4.0 03/03/2017 0619   CL 108 03/03/2017 0619   CO2 24 03/03/2017 0619   GLUCOSE 88 03/03/2017 0619   BUN 10 03/03/2017 0619   CREATININE 0.93 03/03/2017 0619   CALCIUM 7.9 (L) 03/03/2017 0619   GFRNONAA >60 03/03/2017 0619   GFRAA >60 03/03/2017 0619    BNP No results found for: "BNP"  ProBNP No results found for: "PROBNP"  Imaging: No results found.   Assessment & Plan:   Loud snoring - Matthew Mejia has symptoms of loud snoring, witnessed apnea and daytime sleepiness.  Epworth score is 21.  BMI 44.  Strong suspicion Matthew Mejia has underlying obstructive sleep apnea, needs home sleep study to evaluate.  We discussed risks of untreated sleep apnea including cardiac arrhythmias, pulmonary hypertension, stroke and diabetes.  We also reviewed treatment options including weight loss, oral appliance, CPAP therapy or referral to ENT for possible surgical options.  Encourage weight loss efforts.  Advised Matthew Mejia get wedge pillow to elevate head 30 degrees while sleeping.  Advised against driving experiencing excessive daytime sleepiness.  Follow-up 1 to 2 weeks after sleep study to review results and treatment options if needed.   Martyn Ehrich, NP 01/01/2022

## 2022-01-02 NOTE — Progress Notes (Signed)
Reviewed and agree with assessment/plan.   Ketty Bitton, MD [place] 01/02/2022, 11:11 AM Pager:  [phone]  

## 2022-04-21 ENCOUNTER — Telehealth: Payer: Self-pay | Admitting: Primary Care

## 2022-04-21 NOTE — Telephone Encounter (Signed)
Spoke to pt & got him rescheduled for tomorrow.  Nothing further needed.

## 2022-04-21 NOTE — Telephone Encounter (Signed)
PT calling to resched missed Home Sleep Study. Please contact.

## 2022-04-22 ENCOUNTER — Ambulatory Visit: Payer: Medicaid Other

## 2022-04-22 DIAGNOSIS — G4733 Obstructive sleep apnea (adult) (pediatric): Secondary | ICD-10-CM

## 2022-04-22 DIAGNOSIS — R0683 Snoring: Secondary | ICD-10-CM

## 2022-04-24 DIAGNOSIS — G4733 Obstructive sleep apnea (adult) (pediatric): Secondary | ICD-10-CM | POA: Diagnosis not present

## 2022-05-07 ENCOUNTER — Encounter: Payer: Self-pay | Admitting: Primary Care

## 2022-05-07 ENCOUNTER — Ambulatory Visit: Payer: Medicaid Other | Admitting: Primary Care

## 2022-05-07 VITALS — BP 112/74 | HR 96 | Ht 74.0 in | Wt 342.0 lb

## 2022-05-07 DIAGNOSIS — G4730 Sleep apnea, unspecified: Secondary | ICD-10-CM | POA: Diagnosis not present

## 2022-05-07 NOTE — Progress Notes (Signed)
@Patient  ID: Matthew Mejia, male    DOB: 30-Sep-1990, 32 y.o.   MRN: [id number]  Chief Complaint  Patient presents with   Follow-up    HST    Referring provider: Martyn Ehrich, NP  HPI: 32 year old male, some day smoker.  Past medical history significant for meningitis, headaches, neck pain, morbid obesity.  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  Typical bedtime is 7 PM.  It takes him on average 10 to 20 minutes to fall asleep.  Previous [facility] pulmonary encounter:  01/01/2022 Presents today for sleep consult.  Accompanied by his wife. Patient has symptoms of loud snoring, witnessed apnea and daytime sleepiness. Over the last couple of months his wife has noticed that he stops breathing in his sleep, mainly when he sleeps on his back. No nocturnal awakenings.  Typical bedtime is 7 PM.  No issues falling asleep.  No nocturnal awakenings.  He starts his day at 2:30 AM.  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 His weight is up approximately 10 pounds.  He has never had a sleep study.  His father has sleep apnea and wears a CPAP.  Epworth score is 21. No symptoms of narcolepsy, cataplexy or sleepwalking.  Sleep questionnaire Symptoms-  Loud snoring, witnessed apnea, daytime sleepiness  Prior sleep study- None  Bedtime- 7pm Time to fall asleep- 10-45mins  Nocturnal awakenings- 0 Out of bed/start of day- 2:30am Weight changes- 10 lbs Do you operate heavy machinery- Yes Do you currently wear CPAP- No Do you current wear oxygen- No Epworth- 21   05/07/2022- Interim hx  Patient presents today to review sleep study results. He has symptoms of loud snoring, witnessed apnea and excessive daytime sleepiness. He was struggling to stay awake while at work on third shift, this appears to have improved with schedule change.  He does not take any unprescribed medications or sedatives.  He does smoke marijuana.  HST on 04/22/22 showed mild  OSA, AHI 10.2/hour with SpO2 low 84% (94%).  Reviewed sleep study results today and treatment options.  Due to significance of daytime sleepiness recommending patient be started on auto CPAP.  I cautioned patient against marijuana use prior to bedtime.    Allergies  Allergen Reactions   Dog Epithelium Hives and Swelling    Allergic to Dogs   Dog Epithelium Allergy Skin Test Hives and Swelling    Allergic to Dogs    Immunization History  Administered Date(s) Administered   PFIZER(Purple Top)SARS-COV-2 Vaccination 11/02/2019, 11/23/2019    Past Medical History:  Diagnosis Date   Obesity     Tobacco History: Social History   Tobacco Use  Smoking Status Some Days   Packs/day: 0.50   Years: 0.00   Total pack years: 0.00   Types: Cigars, Cigarettes   Passive exposure: Past  Smokeless Tobacco Never  Tobacco Comments   Pt states he smoke 1/2 pack cigs daily.    Ready to quit: Not Answered Counseling given: Not Answered Tobacco comments: Pt states he smoke 1/2 pack cigs daily.    Outpatient Medications Prior to Visit  Medication Sig Dispense Refill   escitalopram (LEXAPRO) 10 MG tablet Take 10 mg by mouth daily.     losartan (COZAAR) 50 MG tablet Take 1 tablet by mouth daily.     naproxen (NAPROSYN) 500 MG tablet Take 1 tablet (500 mg total) by mouth 2 (two) times daily. 30 tablet 0   losartan (COZAAR) 25  MG tablet Take 1 tablet by mouth daily. (Patient not taking: Reported on 05/07/2022)     No facility-administered medications prior to visit.   Review of Systems  Review of Systems  Constitutional:  Positive for fatigue.  HENT: Negative.    Respiratory:  Positive for apnea.   Cardiovascular: Negative.    Physical Exam  BP 112/74 (BP Location: Left Arm, Patient Position: Sitting, Cuff Size: Large)   Pulse 96   Ht 6\' 2"  (1.88 m)   Wt (!) 342 lb (155.1 kg)   SpO2 99%   BMI 43.91 kg/m  Physical Exam Constitutional:      Appearance: Normal appearance. He is  obese.  HENT:     Head: Normocephalic and atraumatic.     Mouth/Throat:     Mouth: Mucous membranes are moist.     Pharynx: Oropharynx is clear.  Cardiovascular:     Rate and Rhythm: Normal rate and regular rhythm.  Pulmonary:     Effort: Pulmonary effort is normal.     Breath sounds: Normal breath sounds.  Musculoskeletal:        General: Normal range of motion.  Skin:    General: Skin is warm and dry.  Neurological:     General: No focal deficit present.     Mental Status: He is alert and oriented to person, place, and time. Mental status is at baseline.  Psychiatric:        Mood and Affect: Mood normal.        Behavior: Behavior normal.        Thought Content: Thought content normal.        Judgment: Judgment normal.      Lab Results:  CBC    Component Value Date/Time   WBC 6.0 03/03/2017 0619   RBC 4.54 03/03/2017 0619   HGB 13.0 03/03/2017 0619   HCT 39.5 03/03/2017 0619   PLT 241 03/03/2017 0619   MCV 87.0 03/03/2017 0619   MCV 89.9 07/04/2012 1440   MCH 28.6 03/03/2017 0619   MCHC 32.9 03/03/2017 0619   RDW 13.8 03/03/2017 0619   LYMPHSABS 2.3 03/03/2017 0619   MONOABS 0.5 03/03/2017 0619   EOSABS 0.1 03/03/2017 0619   BASOSABS 0.1 03/03/2017 0619    BMET    Component Value Date/Time   NA 138 03/03/2017 0619   K 4.0 03/03/2017 0619   CL 108 03/03/2017 0619   CO2 24 03/03/2017 0619   GLUCOSE 88 03/03/2017 0619   BUN 10 03/03/2017 0619   CREATININE 0.93 03/03/2017 0619   CALCIUM 7.9 (L) 03/03/2017 0619   GFRNONAA >60 03/03/2017 0619   GFRAA >60 03/03/2017 0619    BNP No results found for: "BNP"  ProBNP No results found for: "PROBNP"  Imaging: No results found.   Assessment & Plan:   Mild sleep apnea - Patient has symptoms of loud snoring and excessive daytime sleepiness.  Epworth score is 21.  Home sleep study on 04/22/2022 showed mild obstructive sleep apnea, AHI 5.2 an hour with SpO2 low 84%.  Hide note insomnia is somewhat  disproportionate from severity of sleep apnea.  Recommend trial of auto CPAP 5-15cm h20. Encouraged weight loss efforts. Recommend avoiding marijuana at bedtime and/or prior to work or driving.  Advised patient aim to wear CPAP every night for minimum 4 to 6 hours.  Patient need follow-up 31 to 90 days after starting CPAP for compliance check.  Would repeat Epworth score at follow-up.  If daytime sleepiness does not improve  he will need further testing including MSLT.   Martyn Ehrich, NP 05/07/2022

## 2022-05-07 NOTE — Assessment & Plan Note (Signed)
-  Patient has symptoms of loud snoring and excessive daytime sleepiness.  Epworth score is 21.  Home sleep study on 04/22/2022 showed mild obstructive sleep apnea, AHI 5.2 an hour with SpO2 low 84%.  Hide note insomnia is somewhat disproportionate from severity of sleep apnea.  Recommend trial of auto CPAP 5-15cm h20. Encouraged weight loss efforts. Recommend avoiding marijuana at bedtime and/or prior to work or driving.  Advised patient aim to wear CPAP every night for minimum 4 to 6 hours.  Patient need follow-up 31 to 90 days after starting CPAP for compliance check.  Would repeat Epworth score at follow-up.  If daytime sleepiness does not improve he will need further testing including MSLT.

## 2022-05-07 NOTE — Patient Instructions (Addendum)
Home sleep study showed evidence of mild obstructive sleep apnea, you had on average 10 apneic/hypopneic events an hour  Hypersomnia seems slightly out of proportion with the degree of sleep apnea that you have however would recommend trial of auto CPAP and if somnolence does not persist we may need further sleep testing  Recommendations Aim to wear CPAP every night for minimum 4 to 6 hours Advised against driving if sleepy. Avoid sedatives prior to bedtime.  Encouraged weight loss efforts as able  Orders: Auto CPAP 5 to 15 cm H2O  Follow-up Please call to schedule follow-up with Beth NP 31 to 90 days after receiving CPAP for compliance check  CPAP and BIPAP Information CPAP and BIPAP are methods that use air pressure to keep your airways open and to help you breathe well. CPAP and BIPAP use different amounts of pressure. Your health care provider will tell you whether CPAP or BIPAP would be more helpful for you. CPAP stands for "continuous positive airway pressure." With CPAP, the amount of pressure stays the same while you breathe in (inhale) and out (exhale). BIPAP stands for "bi-level positive airway pressure." With BIPAP, the amount of pressure will be higher when you inhale and lower when you exhale. This allows you to take larger breaths. CPAP or BIPAP may be used in the hospital, or your health care provider may want you to use it at home. You may need to have a sleep study before your health care provider can order a machine for you to use at home. What are the advantages? CPAP or BIPAP can be helpful if you have: Sleep apnea. Chronic obstructive pulmonary disease (COPD). Heart failure. Medical conditions that cause muscle weakness, including muscular dystrophy or amyotrophic lateral sclerosis (ALS). Other problems that cause breathing to be shallow, weak, abnormal, or difficult. CPAP and BIPAP are most commonly used for obstructive sleep apnea (OSA) to keep the airways from  collapsing when the muscles relax during sleep. What are the risks? Generally, this is a safe treatment. However, problems may occur, including: Irritated skin or skin sores if the mask does not fit properly. Dry or stuffy nose or nosebleeds. Dry mouth. Feeling gassy or bloated. Sinus or lung infection if the equipment is not cleaned properly. When should CPAP or BIPAP be used? In most cases, the mask only needs to be worn during sleep. Generally, the mask needs to be worn throughout the night and during any daytime naps. People with certain medical conditions may also need to wear the mask at other times, such as when they are awake. Follow instructions from your health care provider about when to use the machine. What happens during CPAP or BIPAP?  Both CPAP and BIPAP are provided by a small machine with a flexible plastic tube that attaches to a plastic mask that you wear. Air is blown through the mask into your nose or mouth. The amount of pressure that is used to blow the air can be adjusted on the machine. Your health care provider will set the pressure setting and help you find the best mask for you. Tips for using the mask Because the mask needs to be snug, some people feel trapped or closed-in (claustrophobic) when first using the mask. If you feel this way, you may need to get used to the mask. One way to do this is to hold the mask loosely over your nose or mouth and then gradually apply the mask more snugly. You can also gradually increase the  amount of time that you use the mask. Masks are available in various types and sizes. If your mask does not fit well, talk with your health care provider about getting a different one. Some common types of masks include: Full face masks, which fit over the mouth and nose. Nasal masks, which fit over the nose. Nasal pillow or prong masks, which fit into the nostrils. If you are using a mask that fits over your nose and you tend to breathe through  your mouth, a chin strap may be applied to help keep your mouth closed. Use a skin barrier to protect your skin as told by your health care provider. Some CPAP and BIPAP machines have alarms that may sound if the mask comes off or develops a leak. If you have trouble with the mask, it is very important that you talk with your health care provider about finding a way to make the mask easier to tolerate. Do not stop using the mask. There could be a negative impact on your health if you stop using the mask. Tips for using the machine Place your CPAP or BIPAP machine on a secure table or stand near an electrical outlet. Know where the on/off switch is on the machine. Follow instructions from your health care provider about how to set the pressure on your machine and when you should use it. Do not eat or drink while the CPAP or BIPAP machine is on. Food or fluids could get pushed into your lungs by the pressure of the CPAP or BIPAP. For home use, CPAP and BIPAP machines can be rented or purchased through home health care companies. Many different brands of machines are available. Renting a machine before purchasing may help you find out which particular machine works well for you. Your health insurance company may also decide which machine you may get. Keep the CPAP or BIPAP machine and attachments clean. Ask your health care provider for specific instructions. Check the humidifier if you have a dry stuffy nose or nosebleeds. Make sure it is working correctly. Follow these instructions at home: Take over-the-counter and prescription medicines only as told by your health care provider. Ask if you can take sinus medicine if your sinuses are blocked. Do not use any products that contain nicotine or tobacco. These products include cigarettes, chewing tobacco, and vaping devices, such as e-cigarettes. If you need help quitting, ask your health care provider. Keep all follow-up visits. This is  important. Contact a health care provider if: You have redness or pressure sores on your head, face, mouth, or nose from the mask or head gear. You have trouble using the CPAP or BIPAP machine. You cannot tolerate wearing the CPAP or BIPAP mask. Someone tells you that you snore even when wearing your CPAP or BIPAP. Get help right away if: You have trouble breathing. You feel confused. Summary CPAP and BIPAP are methods that use air pressure to keep your airways open and to help you breathe well. If you have trouble with the mask, it is very important that you talk with your health care provider about finding a way to make the mask easier to tolerate. Do not stop using the mask. There could be a negative impact to your health if you stop using the mask. Follow instructions from your health care provider about when to use the machine. This information is not intended to replace advice given to you by your health care provider. Make sure you discuss any questions you  have with your health care provider. Document Revised: 11/07/2020 Document Reviewed: 03/09/2020 [employer] Patient Education  2023 [employer].
